# Patient Record
Sex: Male | Born: 1990
Health system: Southern US, Community
[De-identification: ages and names within clinical notes are randomized; demographics above are authoritative.]

---
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
## 2012-07-27 ENCOUNTER — Encounter (HOSPITAL_COMMUNITY): Payer: Self-pay | Admitting: *Deleted

## 2012-07-27 ENCOUNTER — Emergency Department (HOSPITAL_COMMUNITY): Payer: Medicaid Other

## 2012-07-27 ENCOUNTER — Emergency Department (HOSPITAL_COMMUNITY)
Admission: EM | Admit: 2012-07-27 | Discharge: 2012-07-27 | Disposition: A | Discharge status: Home / Self Care | Payer: Medicaid Other | Attending: Emergency Medicine | Admitting: Emergency Medicine

## 2012-07-27 DIAGNOSIS — W2209XA Striking against other stationary object, initial encounter: Secondary | ICD-10-CM | POA: Insufficient documentation

## 2012-07-27 DIAGNOSIS — Y9289 Other specified places as the place of occurrence of the external cause: Secondary | ICD-10-CM | POA: Insufficient documentation

## 2012-07-27 DIAGNOSIS — S62102A Fracture of unspecified carpal bone, left wrist, initial encounter for closed fracture: Secondary | ICD-10-CM

## 2012-07-27 DIAGNOSIS — F17200 Nicotine dependence, unspecified, uncomplicated: Secondary | ICD-10-CM | POA: Insufficient documentation

## 2012-07-27 DIAGNOSIS — Y939 Activity, unspecified: Secondary | ICD-10-CM | POA: Insufficient documentation

## 2012-07-27 DIAGNOSIS — S62109A Fracture of unspecified carpal bone, unspecified wrist, initial encounter for closed fracture: Secondary | ICD-10-CM | POA: Insufficient documentation

## 2012-07-27 MED ORDER — HYDROCODONE-ACETAMINOPHEN 5-325 MG PO TABS: 1.0000 | ORAL_TABLET | ORAL | Status: DC | PRN

## 2012-07-27 MED ORDER — IBUPROFEN 400 MG PO TABS
400.0000 mg | ORAL_TABLET | Freq: Once | ORAL | Status: AC
  Administered 2012-07-27: 400 mg via ORAL
  Filled 2012-07-27: qty 1

## 2012-07-27 MED ORDER — IBUPROFEN 600 MG PO TABS: 600.0000 mg | ORAL_TABLET | Freq: Four times a day (QID) | ORAL | Status: DC | PRN

## 2012-07-27 NOTE — ED Notes (Signed)
Pain both hands and wrists, struck a dresser today when angry, broke the glass on dresser.

## 2012-07-27 NOTE — ED Provider Notes (Signed)
History     CSN: [id number]  Arrival date & time 07/27/12  2155   First MD Initiated Contact with Patient 07/27/12 2254      Chief Complaint  Patient presents with  . Hand Pain    (Consider location/radiation/quality/duration/timing/severity/associated sxs/prior treatment) HPI Comments: Melvern L Nguyen is a 22 y.o. male presenting with pain in his left wrist and his right hand after striking them on a dresser top around 2 PM today out of anger.  His pain is worsened with palpation and range of motion.  He denies weakness or numbness in his hands distal to the injury site.  He has taken no medications prior to arrival but has used ice on his injuries without relief of symptoms.  He has radiation of pain to his distal forearm when he extends his left wrist.  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    The history is provided by the patient.    History reviewed. No pertinent past medical history.  History reviewed. No pertinent past surgical history.  History reviewed. No pertinent family history.  History  Substance Use Topics  . Smoking status: Current Every Day Smoker    Types: Cigarettes  . Smokeless tobacco: Not on file  . Alcohol Use: Yes      Review of Systems  Constitutional: Negative for fever.  Musculoskeletal: Positive for arthralgias. Negative for myalgias and joint swelling.  Skin: Negative for color change and wound.  Neurological: Negative for weakness and numbness.    Allergies  Review of patient's allergies indicates no known allergies.  Home Medications   Current Outpatient Rx  Name  Route  Sig  Dispense  Refill  . HYDROcodone-acetaminophen (NORCO/VICODIN) 5-325 MG per tablet   Oral   Take 1 tablet by mouth every 4 (four) hours as needed for pain.   15 tablet   0   . ibuprofen (ADVIL,MOTRIN) 600 MG tablet   Oral   Take 1 tablet (600 mg total) by mouth every 6 (six) hours as needed for pain.   20  tablet   0     BP 124/71  Pulse 121  Temp(Src) 98 F (36.7 C) (Oral)  Resp 20  Ht 5\' 5"  (1.651 m)  Wt 145 lb (65.772 kg)  BMI 24.13 kg/m2  SpO2 98%  Physical Exam  Constitutional: He appears well-developed and well-nourished.  HENT:  Head: Atraumatic.  Neck: Normal range of motion.  Cardiovascular: Normal rate and regular rhythm.   Pulses equal bilaterally.  recheck of pulse during exam was 90  Musculoskeletal: He exhibits tenderness.       Right hand: He exhibits tenderness and swelling. He exhibits no bony tenderness, normal two-point discrimination, normal capillary refill, no deformity and no laceration. Normal sensation noted. Normal strength noted.  Mild edema noted over lateral dorsal right hand.  No ecchymosis.  Left wrist is tender at ulnar styloid.  He has no pain with active or passive abduction or abduction of the wrist.  Pain is worsened with active and passive extension but not flexion.  Distal sensation is intact, less than 3 second cap refill.  Neurological: He is alert. He has normal strength. He displays normal reflexes. No sensory deficit.  Equal strength  Skin: Skin is warm and dry.  Psychiatric: He has a normal mood and affect.    ED Course  Procedures (including critical care time)  Labs Reviewed - No data to display Dg Wrist Complete  Left  07/27/2012  *RADIOLOGY REPORT*  Clinical Data: Hit dresser with both hands; left wrist pain.  LEFT WRIST - COMPLETE 3+ VIEW  Comparison: None.  Findings: There is no evidence of acute fracture or dislocation.  A mildly displaced ulnar styloid fragment likely reflects remote injury.  The carpal rows are intact, and demonstrate normal alignment.  The joint spaces are preserved.  No significant soft tissue abnormalities are seen.  IMPRESSION:  1.  No evidence of acute fracture or dislocation. 2.  Mildly displaced ulnar styloid fragment likely reflects remote injury.   Original Report Authenticated By: Tonia Ghent, M.D.     Dg Hand Complete Right  07/27/2012  *RADIOLOGY REPORT*  Clinical Data: Hit dresser with both hands; right hand pain.  RIGHT HAND - COMPLETE 3+ VIEW  Comparison: None.  Findings: There is no evidence of fracture or dislocation.  The joint spaces are preserved; the soft tissues are unremarkable in appearance.  The carpal rows are intact, and demonstrate normal alignment.  IMPRESSION: No evidence of fracture or dislocation.   Original Report Authenticated By: Tonia Ghent, M.D.      1. Wrist fracture, closed, left, initial encounter       MDM  Suspect the fragment in his left wrist at the ulnar styloid is new as this is the site of his pain.  He was placed in a Velcro wrist splint and prescribed ibuprofen 600 mg and hydrocodone.  Encouraged ice and elevation.  He was referred to Dr. Romeo Apple for further management of his injury.        Burgess Amor, PA-C 07/27/12 2330

## 2012-07-28 NOTE — ED Provider Notes (Signed)
Medical screening examination/treatment/procedure(s) were performed by non-physician practitioner and as supervising physician I was immediately available for consultation/collaboration.  Nicoletta Dress. Colon Branch, MD 07/28/12 [phone]

## 2012-12-07 ENCOUNTER — Encounter (HOSPITAL_COMMUNITY): Payer: Self-pay | Admitting: *Deleted

## 2012-12-07 ENCOUNTER — Emergency Department (HOSPITAL_COMMUNITY)
Admission: EM | Admit: 2012-12-07 | Discharge: 2012-12-07 | Disposition: A | Discharge status: Home / Self Care | Payer: Worker's Compensation | Attending: Emergency Medicine | Admitting: Emergency Medicine

## 2012-12-07 ENCOUNTER — Emergency Department (HOSPITAL_COMMUNITY): Payer: Worker's Compensation

## 2012-12-07 DIAGNOSIS — F17200 Nicotine dependence, unspecified, uncomplicated: Secondary | ICD-10-CM | POA: Insufficient documentation

## 2012-12-07 DIAGNOSIS — Y9389 Activity, other specified: Secondary | ICD-10-CM | POA: Insufficient documentation

## 2012-12-07 DIAGNOSIS — IMO0002 Reserved for concepts with insufficient information to code with codable children: Secondary | ICD-10-CM | POA: Insufficient documentation

## 2012-12-07 DIAGNOSIS — Y929 Unspecified place or not applicable: Secondary | ICD-10-CM | POA: Insufficient documentation

## 2012-12-07 DIAGNOSIS — S20219A Contusion of unspecified front wall of thorax, initial encounter: Secondary | ICD-10-CM | POA: Insufficient documentation

## 2012-12-07 DIAGNOSIS — Y990 Civilian activity done for income or pay: Secondary | ICD-10-CM | POA: Insufficient documentation

## 2012-12-07 DIAGNOSIS — S20211A Contusion of right front wall of thorax, initial encounter: Secondary | ICD-10-CM

## 2012-12-07 LAB — URINALYSIS, DIPSTICK ONLY
Ketones, ur: NEGATIVE mg/dL
Leukocytes, UA: NEGATIVE
Nitrite: NEGATIVE
Protein, ur: NEGATIVE mg/dL

## 2012-12-07 MED ORDER — IBUPROFEN 600 MG PO TABS: 600.0000 mg | ORAL_TABLET | Freq: Four times a day (QID) | ORAL | Status: DC | PRN
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
## 2012-12-08 NOTE — ED Provider Notes (Signed)
Medical screening examination/treatment/procedure(s) were performed by non-physician practitioner and as supervising physician I was immediately available for consultation/collaboration.   Junius Argyle, MD 12/08/12 1312

## 2013-07-20 ENCOUNTER — Encounter: Payer: Self-pay | Admitting: *Deleted

## 2013-08-03 ENCOUNTER — Ambulatory Visit (INDEPENDENT_AMBULATORY_CARE_PROVIDER_SITE_OTHER): Payer: 59 | Admitting: Family Medicine

## 2013-08-03 ENCOUNTER — Encounter: Payer: Self-pay | Admitting: Family Medicine

## 2013-08-03 VITALS — BP 126/82 | HR 82 | Temp 97.9°F | Resp 14 | Ht 66.5 in | Wt 144.0 lb

## 2013-08-03 DIAGNOSIS — Z23 Encounter for immunization: Secondary | ICD-10-CM

## 2013-08-03 DIAGNOSIS — F17200 Nicotine dependence, unspecified, uncomplicated: Secondary | ICD-10-CM

## 2013-08-03 DIAGNOSIS — Z0000 Encounter for general adult medical examination without abnormal findings: Secondary | ICD-10-CM

## 2013-08-03 DIAGNOSIS — L709 Acne, unspecified: Secondary | ICD-10-CM

## 2013-08-03 DIAGNOSIS — Z113 Encounter for screening for infections with a predominantly sexual mode of transmission: Secondary | ICD-10-CM

## 2013-08-03 DIAGNOSIS — L708 Other acne: Secondary | ICD-10-CM

## 2013-08-03 DIAGNOSIS — Z720 Tobacco use: Secondary | ICD-10-CM | POA: Insufficient documentation

## 2013-08-03 LAB — CBC WITH DIFFERENTIAL/PLATELET
BASOS ABS: 0.1 10*3/uL (ref 0.0–0.1)
Basophils Relative: 1 % (ref 0–1)
EOS ABS: 0.2 10*3/uL (ref 0.0–0.7)
Eosinophils Relative: 3 % (ref 0–5)
HEMATOCRIT: 40.1 % (ref 39.0–52.0)
Hemoglobin: 14.2 g/dL (ref 13.0–17.0)
LYMPHS ABS: 3.4 10*3/uL (ref 0.7–4.0)
Lymphocytes Relative: 65 % — ABNORMAL HIGH (ref 12–46)
MCH: 28.3 pg (ref 26.0–34.0)
MCHC: 35.4 g/dL (ref 30.0–36.0)
MCV: 79.9 fL (ref 78.0–100.0)
MONO ABS: 0.3 10*3/uL (ref 0.1–1.0)
Monocytes Relative: 5 % (ref 3–12)
Neutro Abs: 1.4 10*3/uL — ABNORMAL LOW (ref 1.7–7.7)
Neutrophils Relative %: 26 % — ABNORMAL LOW (ref 43–77)
PLATELETS: 303 10*3/uL (ref 150–400)
RBC: 5.02 MIL/uL (ref 4.22–5.81)
RDW: 14.5 % (ref 11.5–15.5)
WBC: 5.2 10*3/uL (ref 4.0–10.5)

## 2013-08-03 LAB — COMPREHENSIVE METABOLIC PANEL
ALBUMIN: 4.6 g/dL (ref 3.5–5.2)
ALT: 11 U/L (ref 0–53)
AST: 18 U/L (ref 0–37)
Alkaline Phosphatase: 58 U/L (ref 39–117)
BUN: 18 mg/dL (ref 6–23)
CALCIUM: 9.9 mg/dL (ref 8.4–10.5)
CHLORIDE: 102 meq/L (ref 96–112)
CO2: 27 meq/L (ref 19–32)
Creat: 1.04 mg/dL (ref 0.50–1.35)
Glucose, Bld: 90 mg/dL (ref 70–99)
Potassium: 4.1 mEq/L (ref 3.5–5.3)
Sodium: 139 mEq/L (ref 135–145)
Total Bilirubin: 0.9 mg/dL (ref 0.2–1.2)
Total Protein: 7.7 g/dL (ref 6.0–8.3)

## 2013-08-03 LAB — LIPID PANEL
Cholesterol: 159 mg/dL (ref 0–200)
HDL: 53 mg/dL (ref >39)
LDL Cholesterol: 92 mg/dL (ref 0–99)
Total CHOL/HDL Ratio: 3 Ratio
Triglycerides: 70 mg/dL (ref <150)
VLDL: 14 mg/dL (ref 0–40)

## 2013-08-03 LAB — HIV ANTIBODY (ROUTINE TESTING W REFLEX): HIV 1&2 Ab, 4th Generation: NONREACTIVE

## 2013-08-03 LAB — RPR

## 2013-08-03 MED ORDER — BENZOYL PEROXIDE 10 % EX CREA: TOPICAL_CREAM | CUTANEOUS | Status: DC

## 2013-08-03 NOTE — Assessment & Plan Note (Addendum)
Physical done. Fasting labs done. STD check to be done. Discussed safe sex  Also discussed tobacco cessation advised he can try patches or the paper cigarette. He is going to think about this Discussed abstinence from any illicit drugs TDAP given

## 2013-08-03 NOTE — Patient Instructions (Signed)
Release of records- sign and we will figure out who previous doctor was I recommend eye visit once a year I recommend dental visit every 6 months Goal is to  Exercise 30 minutes 5 days a week We will call  with lab results  Try the benzyl peroxide Use Cetaphil washer and moisturizer  Work on the smoking If you want to try nicotine patches let us know F/U as needed

## 2013-08-03 NOTE — Assessment & Plan Note (Signed)
He has some mild acne mostly in the T-zone region. I will have him use that I fill a will also try him on benzyl peroxide

## 2013-08-03 NOTE — Progress Notes (Signed)
Patient ID: Mario Nguyen, male   DOB: 05/06/1990, 23 y.o.   MRN: [id number]   Subjective:    Patient ID: Mario Nguyen, male    DOB: 12/19/1990, 23 y.o.   MRN: [id number]  Patient presents for CPE  patient here for complete physical exam. He is a new patient. He has no past medical history he cannot recall where his previous PCP was from. He currently works full-time at [employer]. He did not finish his high school degree and is thinking about going back to get his GED. He is a smoker about 10 cigarettes a day. He is sexually active with a male partner. He does note that about a month or so ago he had some mild rectal bleeding has not had any since. He does admit to anal sex. To be checked for sex but transmitted diseases    Review Of Systems:  GEN- denies fatigue, fever, weight loss,weakness, recent illness HEENT- denies eye drainage, change in vision, nasal discharge, CVS- denies chest pain, palpitations RESP- denies SOB, cough, wheeze ABD- denies N/V, change in stools, abd pain GU- denies dysuria, hematuria, dribbling, incontinence MSK- denies joint pain, muscle aches, injury Neuro- denies headache, dizziness, syncope, seizure activity       Objective:    BP 126/82  Pulse 82  Temp(Src) 97.9 F (36.6 C) (Oral)  Resp 14  Ht 5' 6.5" (1.689 m)  Wt 144 lb (65.318 kg)  BMI 22.90 kg/m2 GEN- NAD, alert and oriented x3 HEENT- PERRL, EOMI, non injected sclera, pink conjunctiva, MMM, oropharynx clear Neck- Supple, no LAD CVS- RRR, no murmur RESP-CTAB ABD-NABS,soft,NT,ND Rectum- Deferred EXT- No edema Pulses- Radial, DP- 2+ Psych- normal affect and mood Skin- acne on chin and forehead, no large pustules, dry skin, few blackheads on nose       Assessment & Plan:      Problem List Items Addressed This Visit   None      Note: This dictation was prepared with Dragon dictation along with smaller phrase technology. Any transcriptional errors that result  from this process are unintentional.

## 2013-08-03 NOTE — Assessment & Plan Note (Signed)
Counsel on cessation

## 2013-08-04 LAB — GC PROBE AMPLIFICATION, URINE: GC PROBE AMP, URINE: NEGATIVE

## 2013-10-28 ENCOUNTER — Encounter (HOSPITAL_COMMUNITY): Payer: Self-pay | Admitting: Emergency Medicine

## 2013-10-28 ENCOUNTER — Emergency Department (HOSPITAL_COMMUNITY)
Admission: EM | Admit: 2013-10-28 | Discharge: 2013-10-28 | Disposition: A | Discharge status: Home / Self Care | Payer: 59 | Attending: Emergency Medicine | Admitting: Emergency Medicine

## 2013-10-28 DIAGNOSIS — S51809A Unspecified open wound of unspecified forearm, initial encounter: Secondary | ICD-10-CM | POA: Insufficient documentation

## 2013-10-28 DIAGNOSIS — W503XXA Accidental bite by another person, initial encounter: Secondary | ICD-10-CM

## 2013-10-28 DIAGNOSIS — S51851A Open bite of right forearm, initial encounter: Secondary | ICD-10-CM

## 2013-10-28 MED ORDER — AMOXICILLIN-POT CLAVULANATE 875-125 MG PO TABS
1.0000 | ORAL_TABLET | Freq: Once | ORAL | Status: AC
  Administered 2013-10-28: 1 via ORAL
  Filled 2013-10-28: qty 1

## 2013-10-28 MED ORDER — AMOXICILLIN-POT CLAVULANATE 875-125 MG PO TABS: 1.0000 | ORAL_TABLET | Freq: Two times a day (BID) | ORAL | Status: DC
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
## 2013-11-01 NOTE — ED Provider Notes (Signed)
Medical screening examination/treatment/procedure(s) were performed by non-physician practitioner and as supervising physician I was immediately available for consultation/collaboration.   EKG Interpretation None        Kyriana Yankee L Lisamarie Coke, MD 11/01/13 0710 

## 2014-10-06 ENCOUNTER — Emergency Department (HOSPITAL_COMMUNITY)
Admission: EM | Admit: 2014-10-06 | Discharge: 2014-10-06 | Disposition: A | Discharge status: Home / Self Care | Payer: Medicaid Other | Attending: Emergency Medicine | Admitting: Emergency Medicine

## 2014-10-06 ENCOUNTER — Encounter (HOSPITAL_COMMUNITY): Payer: Self-pay | Admitting: *Deleted

## 2014-10-06 DIAGNOSIS — Z720 Tobacco use: Secondary | ICD-10-CM | POA: Insufficient documentation

## 2014-10-06 DIAGNOSIS — H53149 Visual discomfort, unspecified: Secondary | ICD-10-CM | POA: Insufficient documentation

## 2014-10-06 DIAGNOSIS — H109 Unspecified conjunctivitis: Secondary | ICD-10-CM | POA: Insufficient documentation

## 2014-10-06 MED ORDER — KETOROLAC TROMETHAMINE 0.5 % OP SOLN
1.0000 [drp] | Freq: Once | OPHTHALMIC | Status: AC
  Administered 2014-10-06: 1 [drp] via OPHTHALMIC
  Filled 2014-10-06: qty 5

## 2014-10-06 MED ORDER — TOBRAMYCIN 0.3 % OP SOLN
1.0000 [drp] | Freq: Once | OPHTHALMIC | Status: AC
  Administered 2014-10-06: 1 [drp] via OPHTHALMIC
  Filled 2014-10-06: qty 5

## 2014-10-06 NOTE — Discharge Instructions (Signed)

## 2014-10-06 NOTE — ED Notes (Signed)
Lt upper lid swollen, d/c present.  Rt eye 20/20,  Lt eye 20/30  Both 20/20.  No  Injury.

## 2014-10-06 NOTE — ED Notes (Signed)
Pt states pain to left eye began 2 days ago. Woke up yesterday morning with swelling to left eye lid.

## 2014-10-08 NOTE — ED Provider Notes (Signed)
CSN: [id number]     Arrival date & time 10/06/14  4098 History   First MD Initiated Contact with Patient 10/06/14 1125     Chief Complaint  Patient presents with  . Eye Pain     (Consider location/radiation/quality/duration/timing/severity/associated sxs/prior Treatment) HPI   Mario Nguyen is a 24 y.o. male who presents to the Emergency Department complaining of discharge and pain to left eye that began 2 days ago.  Woke up one day prior to arrival with swelling to his left upper eyelid.  He describes irritation to his eye with blinking and sunlight.  reports having green discharge to both corners of his eyes and some itching.  He denies headache, dizziness, visual changes, fever, contact use or injury.  He has not tried any medication or therapies for the symptoms.     History reviewed. No pertinent past medical history. History reviewed. No pertinent past surgical history. Family History  Problem Relation Age of Onset  . Hypertension Mother   . Learning disabilities Maternal Grandmother   . Stroke Maternal Grandfather    History  Substance Use Topics  . Smoking status: Current Every Day Smoker    Types: Cigarettes  . Smokeless tobacco: Never Used  . Alcohol Use: No    Review of Systems  Constitutional: Negative for fever and appetite change.  HENT: Negative for congestion, rhinorrhea and sore throat.   Eyes: Positive for photophobia, pain, discharge and itching.  Respiratory: Negative for cough.   Gastrointestinal: Negative for nausea and vomiting.  Neurological: Negative for dizziness, numbness and headaches.  All other systems reviewed and are negative.     Allergies  Review of patient's allergies indicates no known allergies.  Home Medications   Prior to Admission medications   Not on File   BP 112/96 mmHg  Pulse 69  Temp(Src) 98.4 F (36.9 C) (Oral)  Resp 16  SpO2 100% Physical Exam  Constitutional: He is oriented to person, place, and time. He  appears well-developed and well-nourished. No distress.  HENT:  Head: Normocephalic and atraumatic.  Mouth/Throat: Oropharynx is clear and moist.  Eyes: EOM are normal. Pupils are equal, round, and reactive to light. Lids are everted and swept, no foreign bodies found. Left eye exhibits exudate. Left eye exhibits no hordeolum. No foreign body present in the left eye. Right conjunctiva is not injected. Left conjunctiva is injected. Left conjunctiva has no hemorrhage.  Mild edema of the left upper eyelid.  No periorbital erythema or edema.  Exudate present at medial and lateral canthus.    Neck: Normal range of motion. Neck supple. No thyromegaly present.  Cardiovascular: Normal rate and regular rhythm.   No murmur heard. Pulmonary/Chest: Effort normal and breath sounds normal. No respiratory distress.  Musculoskeletal: Normal range of motion.  Lymphadenopathy:    He has no cervical adenopathy.  Neurological: He is alert and oriented to person, place, and time. He exhibits normal muscle tone. Coordination normal.  Skin: Skin is warm and dry. No rash noted.  Nursing note and vitals reviewed.   ED Course  Procedures (including critical care time) Labs Review Labs Reviewed - No data to display  Imaging Review No results found.   EKG Interpretation None      MDM   Final diagnoses:  Conjunctivitis of left eye      Visual Acuity  Rt eye 20/20, Lt eye 20/30 Both 20/20.   Pt well appearing.  Dispensed ketorolac and tobramycin.  Agrees to warm compresses and ophtho f/u  if needed.    Pauline Aus, PA-C 10/08/14 1509  Vanetta Mulders, MD 10/11/14 2010

## 2014-10-09 ENCOUNTER — Ambulatory Visit: Payer: Self-pay | Admitting: Family Medicine

## 2014-10-10 ENCOUNTER — Encounter: Payer: Self-pay | Admitting: Family Medicine

## 2014-10-10 ENCOUNTER — Ambulatory Visit (INDEPENDENT_AMBULATORY_CARE_PROVIDER_SITE_OTHER): Payer: PRIVATE HEALTH INSURANCE | Admitting: Family Medicine

## 2014-10-10 VITALS — BP 118/62 | HR 68 | Temp 97.9°F | Resp 16 | Ht 66.5 in | Wt 141.0 lb

## 2014-10-10 DIAGNOSIS — H109 Unspecified conjunctivitis: Secondary | ICD-10-CM | POA: Diagnosis not present

## 2014-10-10 MED ORDER — TOBRAMYCIN 0.3 % OP SOLN: 1.0000 [drp] | Freq: Four times a day (QID) | OPHTHALMIC | Status: DC

## 2014-10-10 NOTE — Progress Notes (Signed)
Patient ID: Mario Nguyen, male   DOB: 1990/12/16, 24 y.o.   MRN: [id number]   Subjective:    Patient ID: Mario Nguyen, male    DOB: Jan 19, 1991, 24 y.o.   MRN: [id number]  Patient presents for Eye Drainage  Patient here with conjunctivitis. She was seen in the emergency room on Sunday the 19th he was prescribed Tobrex eyedrops as well as Ketorolac for pain. His drainage and swelling started with his left upper lid then he had yellow and green drainage from the eye. He follows with the emergency room on Sunday. He did not have any change in his vision no fever no cough or congestion no tear pain. Per above prescribed eyedrops were given. The next day he noticed drainage from his right eye he did put a drop in his right eye yesterday. The discomfort has improved but he still has some drainage from his eyes. He does not wear contacts. Review Of Systems:  GEN- denies fatigue, fever, weight loss,weakness, recent illness HEENT- + eye drainage,denies change in vision, nasal discharge, CVS- denies chest pain, palpitations RESP- denies SOB, cough, wheeze ABD- denies N/V, change in stools, abd pain GU- denies dysuria, hematuria, dribbling, incontinence MSK- denies joint pain, muscle aches, injury Neuro- denies headache, dizziness, syncope, seizure activity       Objective:    BP 118/62 mmHg  Pulse 68  Temp(Src) 97.9 F (36.6 C) (Oral)  Resp 16  Ht 5' 6.5" (1.689 m)  Wt 141 lb (63.957 kg)  BMI 22.42 kg/m2 GEN- NAD, alert and oriented x3 HEENT- PERRL, EOMI, non injected sclera, + injected bilat conjunctiva upper and lower, Left upper lid mild swelling on nasal side- small white pustule seen , NT, mild peeling of skin of upper lid MMM, oropharynx clear, vision in tact Neck- Supple, no LAD        Assessment & Plan:      Problem List Items Addressed This Visit    None    Visit Diagnoses    Bilateral conjunctivitis    -  Primary    Bilat conjunctivitis, may be developing  stye on left upper lid as well, advised warm compressses, use tobrex in both eyes, drop refilled       Note: This dictation was prepared with Dragon dictation along with smaller phrase technology. Any transcriptional errors that result from this process are unintentional.

## 2014-10-10 NOTE — Patient Instructions (Signed)
Use warm compresses four times a day  Put antibiotic drop in both eyes, prescription refilled F/U as needed

## 2014-10-17 ENCOUNTER — Telehealth: Payer: Self-pay | Admitting: General Practice

## 2014-10-17 NOTE — Telephone Encounter (Signed)
Patient was not given work note from us.   Why is he needing one now and not for the week before?

## 2014-10-17 NOTE — Telephone Encounter (Signed)
Pt called he is needing a work note to be out of work on Wednesday June 29 - June 30 states that his eye is no better with antibiotics. Pt was seen on June 21st with eye issues.

## 2014-10-18 NOTE — Telephone Encounter (Signed)
Pt aware if symptoms have returned needs to come in for OV, pt states that that he will go to his eye doctor and get work excuse.

## 2014-10-18 NOTE — Telephone Encounter (Signed)
I asked that same question, he said he was fine but now his eyes are still hurting and wanted to get a work note for this time period

## 2014-10-18 NOTE — Telephone Encounter (Signed)
Needs to be seen if S/Sx returned.

## 2015-02-20 ENCOUNTER — Ambulatory Visit: Payer: PRIVATE HEALTH INSURANCE | Admitting: Family Medicine

## 2015-02-21 ENCOUNTER — Ambulatory Visit: Payer: PRIVATE HEALTH INSURANCE | Admitting: Family Medicine

## 2015-08-29 ENCOUNTER — Other Ambulatory Visit: Payer: Self-pay | Admitting: Family Medicine

## 2015-08-29 ENCOUNTER — Encounter: Payer: Self-pay | Admitting: Family Medicine

## 2015-08-29 ENCOUNTER — Ambulatory Visit (INDEPENDENT_AMBULATORY_CARE_PROVIDER_SITE_OTHER): Payer: PRIVATE HEALTH INSURANCE | Admitting: Family Medicine

## 2015-08-29 VITALS — BP 118/64 | HR 88 | Temp 98.5°F | Resp 12 | Ht 67.0 in | Wt 139.0 lb

## 2015-08-29 DIAGNOSIS — Z113 Encounter for screening for infections with a predominantly sexual mode of transmission: Secondary | ICD-10-CM | POA: Diagnosis not present

## 2015-08-29 DIAGNOSIS — M25561 Pain in right knee: Secondary | ICD-10-CM

## 2015-08-29 DIAGNOSIS — Z0000 Encounter for general adult medical examination without abnormal findings: Secondary | ICD-10-CM

## 2015-08-29 DIAGNOSIS — F17200 Nicotine dependence, unspecified, uncomplicated: Secondary | ICD-10-CM | POA: Diagnosis not present

## 2015-08-29 LAB — CBC WITH DIFFERENTIAL/PLATELET
BASOS ABS: 63 {cells}/uL (ref 0–200)
BASOS PCT: 1 %
EOS ABS: 126 {cells}/uL (ref 15–500)
Eosinophils Relative: 2 %
HEMATOCRIT: 43 % (ref 38.5–50.0)
HEMOGLOBIN: 14.8 g/dL (ref 13.0–17.0)
LYMPHS ABS: 3087 {cells}/uL (ref 850–3900)
Lymphocytes Relative: 49 %
MCH: 28.2 pg (ref 27.0–33.0)
MCHC: 34.4 g/dL (ref 32.0–36.0)
MCV: 81.9 fL (ref 80.0–100.0)
MONO ABS: 252 {cells}/uL (ref 200–950)
MONOS PCT: 4 %
MPV: 8.3 fL (ref 7.5–12.5)
NEUTROS ABS: 2772 {cells}/uL (ref 1500–7800)
Neutrophils Relative %: 44 %
PLATELETS: 302 10*3/uL (ref 140–400)
RBC: 5.25 MIL/uL (ref 4.20–5.80)
RDW: 14.8 % (ref 11.0–15.0)
WBC: 6.3 10*3/uL (ref 3.8–10.8)

## 2015-08-29 LAB — URINALYSIS, MICROSCOPIC ONLY
CASTS: NONE SEEN [LPF]
Crystals: NONE SEEN [HPF]
RBC / HPF: NONE SEEN RBC/HPF (ref <=2)
SQUAMOUS EPITHELIAL / LPF: NONE SEEN [HPF] (ref <=5)
WBC, UA: NONE SEEN WBC/HPF (ref <=5)
YEAST: NONE SEEN [HPF]

## 2015-08-29 LAB — URINALYSIS, ROUTINE W REFLEX MICROSCOPIC
BILIRUBIN URINE: NEGATIVE
GLUCOSE, UA: NEGATIVE
HGB URINE DIPSTICK: NEGATIVE
LEUKOCYTES UA: NEGATIVE
Nitrite: NEGATIVE
Specific Gravity, Urine: >1.03 (ref 1.001–1.035)
pH: 6 (ref 5.0–8.0)

## 2015-08-29 LAB — TSH: TSH: 0.92 m[IU]/L (ref 0.40–4.50)

## 2015-08-29 NOTE — Progress Notes (Signed)
Patient ID: Mario Nguyen, male   DOB: 06/18/1990, 25 y.o.   MRN: [id number]    Subjective:    Patient ID: Mario Nguyen, male    DOB: 03/07/1991, 25 y.o.   MRN: [id number]  Patient presents for CPE and L Eye Pain  Pt here for complete physical exam. He is still concerned he has some irritation to his left eye he has not had any drainage no abnormal watering no redness does not remember getting anything in the eye. He did flush it. His vision is intact.  He occasionally gets some discomfort in his left knee he states that he gave out on him at work he does wear steele toe boots and works on concrete floors 12 hour shifts. No particular injury to the knee has not had any swelling.  He is not on any regular medications. He does continue to smoke about half a pack per day. He states he did not want to nicoderm patch because his mother was concerned about the chemicals in them??  He is sexually active, does not use condoms Also states urine has been dark, denies penile discharge   TDAP UTD   Review Of Systems:  GEN- denies fatigue, fever, weight loss,weakness, recent illness HEENT- denies eye drainage, change in vision, nasal discharge, CVS- denies chest pain, palpitations RESP- denies SOB, cough, wheeze ABD- denies N/V, change in stools, abd pain GU- denies dysuria, hematuria, dribbling, incontinence MSK- denies joint pain, muscle aches, injury Neuro- denies headache, dizziness, syncope, seizure activity       Objective:    BP 118/64 mmHg  Pulse 88  Temp(Src) 98.5 F (36.9 C) (Oral)  Resp 12  Ht 5\' 7"  (1.702 m)  Wt 139 lb (63.05 kg)  BMI 21.77 kg/m2 GEN- NAD, alert and oriented x3 HEENT- PERRL, EOMI, non injected sclera, pink conjunctiva, MMM, oropharynx clear Neck- Supple, no thyromegaly CVS- RRR, no murmur RESP-CTAB ABD-NABS,soft,NT,ND Knee- Normal inspection, FROM bilat knee, ligaments in tact  EXT- No edema Pulses- Radial, DP- 2+        Assessment &  Plan:      Problem List Items Addressed This Visit    Tobacco use disorder    counsled on tobacco cessation      Screening for STD (sexually transmitted disease)   Relevant Orders   HIV antibody   Urinalysis, Routine w reflex microscopic ([facility]) (Completed)   HSV(herpes smplx)abs-1+2(IgG+IgM)-bld   RPR   GC/chlamydia probe amp, urine   Routine general medical examination at a health care facility - Primary    CPE done, STD screen, normal eye exam, can use visine, may be pollen irritating eye      Relevant Orders   CBC with Differential/Platelet   Comprehensive metabolic panel   Lipid panel   TSH    Other Visit Diagnoses    Right knee pain        Normal exam, if this wosrens or has swelling will have evaluated further       Note: This dictation was prepared with Dragon dictation along with smaller phrase technology. Any transcriptional errors that result from this process are unintentional.

## 2015-08-29 NOTE — Patient Instructions (Signed)
Call if pain worsens or you get swelling of your knee We will call with lab results Okay to use Visine in eye for irritation F/U as needed or in 1year

## 2015-08-29 NOTE — Assessment & Plan Note (Signed)
counsled on tobacco cessation 

## 2015-08-29 NOTE — Assessment & Plan Note (Addendum)
CPE done, STD screen, normal eye exam, can use visine, may be pollen irritating eye

## 2015-08-30 LAB — HIV ANTIBODY (ROUTINE TESTING W REFLEX): HIV: NONREACTIVE

## 2015-08-30 LAB — COMPREHENSIVE METABOLIC PANEL
ALBUMIN: 4.8 g/dL (ref 3.6–5.1)
ALK PHOS: 66 U/L (ref 40–115)
ALT: 17 U/L (ref 9–46)
AST: 23 U/L (ref 10–40)
BUN: 18 mg/dL (ref 7–25)
CO2: 25 mmol/L (ref 20–31)
Calcium: 9.8 mg/dL (ref 8.6–10.3)
Chloride: 102 mmol/L (ref 98–110)
Creat: 1.05 mg/dL (ref 0.60–1.35)
GLUCOSE: 81 mg/dL (ref 70–99)
POTASSIUM: 4.2 mmol/L (ref 3.5–5.3)
Sodium: 138 mmol/L (ref 135–146)
TOTAL PROTEIN: 7.9 g/dL (ref 6.1–8.1)
Total Bilirubin: 0.8 mg/dL (ref 0.2–1.2)

## 2015-08-30 LAB — LIPID PANEL
CHOL/HDL RATIO: 3.6 ratio (ref <=5.0)
Cholesterol: 160 mg/dL (ref 125–200)
HDL: 44 mg/dL (ref >=40)
LDL Cholesterol: 101 mg/dL (ref <130)
Triglycerides: 76 mg/dL (ref <150)
VLDL: 15 mg/dL (ref <30)

## 2015-08-30 LAB — HSV(HERPES SMPLX)ABS-I+II(IGG+IGM)-BLD
HSV 1 Glycoprotein G Ab, IgG: 32.9 Index — ABNORMAL HIGH (ref <0.90)
HSV 2 Glycoprotein G Ab, IgG: <0.9 Index (ref <0.90)
Herpes Simplex Vrs I&II-IgM Ab (EIA): 4.31 INDEX — ABNORMAL HIGH

## 2015-08-30 LAB — GC/CHLAMYDIA PROBE AMP
CT Probe RNA: NOT DETECTED
GC Probe RNA: NOT DETECTED

## 2015-08-30 LAB — RPR

## 2015-09-25 ENCOUNTER — Encounter (HOSPITAL_COMMUNITY): Payer: Self-pay | Admitting: Emergency Medicine

## 2015-09-25 ENCOUNTER — Emergency Department (HOSPITAL_COMMUNITY): Payer: Self-pay

## 2015-09-25 ENCOUNTER — Emergency Department (HOSPITAL_COMMUNITY)
Admission: EM | Admit: 2015-09-25 | Discharge: 2015-09-25 | Disposition: A | Discharge status: Home / Self Care | Payer: Self-pay | Attending: Emergency Medicine | Admitting: Emergency Medicine

## 2015-09-25 DIAGNOSIS — F17210 Nicotine dependence, cigarettes, uncomplicated: Secondary | ICD-10-CM | POA: Insufficient documentation

## 2015-09-25 DIAGNOSIS — Y999 Unspecified external cause status: Secondary | ICD-10-CM | POA: Insufficient documentation

## 2015-09-25 DIAGNOSIS — Y929 Unspecified place or not applicable: Secondary | ICD-10-CM | POA: Insufficient documentation

## 2015-09-25 DIAGNOSIS — W228XXA Striking against or struck by other objects, initial encounter: Secondary | ICD-10-CM | POA: Insufficient documentation

## 2015-09-25 DIAGNOSIS — S9032XA Contusion of left foot, initial encounter: Secondary | ICD-10-CM | POA: Insufficient documentation

## 2015-09-25 DIAGNOSIS — Y939 Activity, unspecified: Secondary | ICD-10-CM | POA: Insufficient documentation

## 2015-09-25 NOTE — Discharge Instructions (Signed)

## 2015-09-25 NOTE — ED Provider Notes (Addendum)
CSN: [id number]     Arrival date & time 09/25/15  1841 History   First MD Initiated Contact with Patient 09/25/15 1918     Chief Complaint  Patient presents with  . Foot Pain     (Consider location/radiation/quality/duration/timing/severity/associated sxs/prior Treatment) Patient is a 25 y.o. male presenting with lower extremity pain. The history is provided by the patient. No language interpreter was used.  Foot Pain This is a new problem. The current episode started today. The problem occurs constantly. The problem has been unchanged. Associated symptoms include myalgias. Pertinent negatives include no joint swelling or numbness. Nothing aggravates the symptoms. He has tried nothing for the symptoms. The treatment provided moderate relief.    History reviewed. No pertinent past medical history. History reviewed. No pertinent past surgical history. Family History  Problem Relation Age of Onset  . Hypertension Mother   . Learning disabilities Maternal Grandmother   . Stroke Maternal Grandfather    Social History  Substance Use Topics  . Smoking status: Current Every Day Smoker -- 0.50 packs/day    Types: Cigarettes  . Smokeless tobacco: Never Used  . Alcohol Use: Yes     Comment: occasional    Review of Systems  Musculoskeletal: Positive for myalgias. Negative for joint swelling.  Neurological: Negative for numbness.  All other systems reviewed and are negative.     Allergies  Review of patient's allergies indicates no known allergies.  Home Medications   Prior to Admission medications   Not on File   BP 123/72 mmHg  Pulse 103  Temp(Src) 98.9 F (37.2 C) (Oral)  Resp 18  Ht 5\' 5"  (1.651 m)  Wt 63.05 kg  BMI 23.13 kg/m2  SpO2 100% Physical Exam  Constitutional: He is oriented to person, place, and time. He appears well-developed and well-nourished.  HENT:  Head: Normocephalic.  Musculoskeletal: He exhibits tenderness.  Tender left foot,  Pain with movement   Neurological: He is alert and oriented to person, place, and time. He has normal reflexes.  Skin: Skin is warm.  Psychiatric: He has a normal mood and affect.  Vitals reviewed.   ED Course  Procedures (including critical care time) Labs Review Labs Reviewed - No data to display  Imaging Review Dg Foot Complete Left  09/25/2015  CLINICAL DATA:  25 year old male stepped on foreign object presenting with foot pain. EXAM: LEFT FOOT - COMPLETE 3+ VIEW COMPARISON:  None. FINDINGS: There is no acute fracture or dislocation. The bones are well mineralized. The soft tissues appear unremarkable. No radiopaque foreign object identified. IMPRESSION: Negative. Electronically Signed   By: Elgie CollardArash  Radparvar M.D.   On: 09/25/2015 19:05   I have personally reviewed and evaluated these images and lab results as part of my medical decision-making.   EKG Interpretation None      MDM Pt advised to follow up with primary if apin persist   Final diagnoses:  Contusion of left foot, initial encounter    An After Visit Summary was printed and given to the patient. Soak foot x 4 times a day  Ibuprofen    Elson AreasLeslie K Rillie Riffel, PA-C 09/25/15 2143  Bethann BerkshireJoseph Zammit, MD 09/27/15 1234  Lonia SkinnerLeslie K East RutherfordSofia, PA-C 11/12/15 1133  Bethann BerkshireJoseph Zammit, MD 11/12/15 1430

## 2015-09-25 NOTE — ED Notes (Signed)
Pt reports stepped on "something sharp" earlier this afternoon. Pt reports pain in left foot ever since. No foreign body noted. Small reddened area noted to bottom of left foot. nad noted.

## 2016-03-06 ENCOUNTER — Encounter: Payer: Self-pay | Admitting: Physician Assistant

## 2016-03-06 ENCOUNTER — Ambulatory Visit (INDEPENDENT_AMBULATORY_CARE_PROVIDER_SITE_OTHER): Payer: Self-pay | Admitting: Physician Assistant

## 2016-03-06 VITALS — BP 110/70 | HR 89 | Temp 98.4°F | Resp 16 | Wt 146.0 lb

## 2016-03-06 DIAGNOSIS — J020 Streptococcal pharyngitis: Secondary | ICD-10-CM

## 2016-03-06 DIAGNOSIS — J988 Other specified respiratory disorders: Secondary | ICD-10-CM

## 2016-03-06 DIAGNOSIS — B9789 Other viral agents as the cause of diseases classified elsewhere: Secondary | ICD-10-CM

## 2016-03-06 LAB — STREP GROUP A AG, W/REFLEX TO CULT: STREGTOCOCCUS GROUP A AG SCREEN: NOT DETECTED

## 2016-03-06 NOTE — Progress Notes (Signed)
    Patient ID: Mario Nguyen MRN: [id number], DOB: 09/12/1990, 25 y.o. Date of Encounter: 03/06/2016, 3:37 PM    Chief Complaint:  Chief Complaint  Patient presents with  . Sore Throat    started 11-14     HPI: 25 y.o. year old male presetns with abov.   Says the symptoms just started Tuesday 03/06/16. Says at that time he was just feeling some drainage in his throat. He has continued with same symptoms. Says that right now his throat just feels itchy and feels like there is some drainage in there. Says he has had no cough. No mucus from his nose. No fever.     Home Meds:   No outpatient prescriptions prior to visit.   No facility-administered medications prior to visit.     Allergies: No Known Allergies    Review of Systems: See HPI for pertinent ROS. All other ROS negative.    Physical Exam: Blood pressure 110/70, pulse 89, temperature 98.4 F (36.9 C), temperature source Oral, resp. rate 16, weight 146 lb (66.2 kg), SpO2 99 %., Body mass index is 24.3 kg/m. General:  WNWD AAM. Appears in no acute distress. HEENT: Normocephalic, atraumatic, eyes without discharge, sclera non-icteric, nares are without discharge. Bilateral auditory canals clear, TM's are without perforation, pearly grey and translucent with reflective cone of light bilaterally. Oral cavity moist, posterior pharynx without exudate, erythema, peritonsillar abscess.  Neck: Supple. No thyromegaly. No lymphadenopathy. Lungs: Clear bilaterally to auscultation without wheezes, rales, or rhonchi. Breathing is unlabored. Heart: Regular rhythm. No murmurs, rubs, or gallops. Msk:  Strength and tone normal for age. Extremities/Skin: Warm and dry. Neuro: Alert and oriented X 3. Moves all extremities spontaneously. Gait is normal. CNII-XII grossly in tact. Psych:  Responds to questions appropriately with a normal affect.   Results for orders placed or performed in visit on 03/06/16  STREP GROUP A AG, W/REFLEX  TO CULT  Result Value Ref Range   SOURCE THROAT    STREGTOCOCCUS GROUP A AG SCREEN Not Detected      ASSESSMENT AND PLAN:  25 y.o. year old male with  1. Viral respiratory infection Discussed with him that this is most likely a virus in which case this will run its course and resolve on its own over the week. If symptoms worsen significantly or develops fever then follow-up. As well if symptoms persist greater than 7-10 days  follow-up also.  2. Streptococcal sore throat - STREP GROUP A AG, W/REFLEX TO CULT   Signed, [address] W[address]Mary Beth Garza-Salinas IIDixon, [place], [facility] 03/06/2016 3:37 PM

## 2016-03-08 LAB — CULTURE, GROUP A STREP: Organism ID, Bacteria: NORMAL

## 2016-07-30 ENCOUNTER — Encounter: Payer: Self-pay | Admitting: Family Medicine

## 2016-08-25 ENCOUNTER — Encounter: Payer: Self-pay | Admitting: Family Medicine

## 2016-11-04 ENCOUNTER — Encounter: Payer: Self-pay | Admitting: Family Medicine

## 2016-11-04 ENCOUNTER — Emergency Department (HOSPITAL_COMMUNITY)
Admission: EM | Admit: 2016-11-04 | Discharge: 2016-11-04 | Disposition: A | Discharge status: Home / Self Care | Payer: Medicaid Other | Attending: Emergency Medicine | Admitting: Emergency Medicine

## 2016-11-04 ENCOUNTER — Encounter (HOSPITAL_COMMUNITY): Payer: Self-pay

## 2016-11-04 DIAGNOSIS — F17210 Nicotine dependence, cigarettes, uncomplicated: Secondary | ICD-10-CM | POA: Insufficient documentation

## 2016-11-04 DIAGNOSIS — R197 Diarrhea, unspecified: Secondary | ICD-10-CM | POA: Insufficient documentation

## 2016-11-04 DIAGNOSIS — R112 Nausea with vomiting, unspecified: Secondary | ICD-10-CM | POA: Insufficient documentation

## 2016-11-04 LAB — URINALYSIS, ROUTINE W REFLEX MICROSCOPIC
Bacteria, UA: NONE SEEN
GLUCOSE, UA: NEGATIVE mg/dL
HGB URINE DIPSTICK: NEGATIVE
KETONES UR: 20 mg/dL — AB
Leukocytes, UA: NEGATIVE
NITRITE: NEGATIVE
PH: 5 (ref 5.0–8.0)
Protein, ur: 100 mg/dL — AB
RBC / HPF: NONE SEEN RBC/hpf (ref 0–5)
Specific Gravity, Urine: 1.033 — ABNORMAL HIGH (ref 1.005–1.030)

## 2016-11-04 LAB — COMPREHENSIVE METABOLIC PANEL
ALK PHOS: 55 U/L (ref 38–126)
ALT: 30 U/L (ref 17–63)
ANION GAP: 13 (ref 5–15)
AST: 26 U/L (ref 15–41)
Albumin: 5.2 g/dL — ABNORMAL HIGH (ref 3.5–5.0)
BUN: 20 mg/dL (ref 6–20)
CALCIUM: 10 mg/dL (ref 8.9–10.3)
CO2: 27 mmol/L (ref 22–32)
CREATININE: 1.16 mg/dL (ref 0.61–1.24)
Chloride: 97 mmol/L — ABNORMAL LOW (ref 101–111)
GFR calc non Af Amer: >60 mL/min (ref >60)
Glucose, Bld: 137 mg/dL — ABNORMAL HIGH (ref 65–99)
Potassium: 3.7 mmol/L (ref 3.5–5.1)
SODIUM: 137 mmol/L (ref 135–145)
TOTAL PROTEIN: 9.2 g/dL — AB (ref 6.5–8.1)
Total Bilirubin: 1.8 mg/dL — ABNORMAL HIGH (ref 0.3–1.2)

## 2016-11-04 LAB — CBC
HCT: 45.6 % (ref 39.0–52.0)
HEMOGLOBIN: 16.1 g/dL (ref 13.0–17.0)
MCH: 28.6 pg (ref 26.0–34.0)
MCHC: 35.3 g/dL (ref 30.0–36.0)
MCV: 81 fL (ref 78.0–100.0)
PLATELETS: 305 10*3/uL (ref 150–400)
RBC: 5.63 MIL/uL (ref 4.22–5.81)
RDW: 13.2 % (ref 11.5–15.5)
WBC: 11 10*3/uL — ABNORMAL HIGH (ref 4.0–10.5)

## 2016-11-04 LAB — LIPASE, BLOOD: Lipase: 20 U/L (ref 11–51)

## 2016-11-04 MED ORDER — ONDANSETRON HCL 8 MG PO TABS: 8.0000 mg | ORAL_TABLET | Freq: Three times a day (TID) | ORAL | 0 refills | Status: DC | PRN

## 2016-11-04 MED ORDER — ONDANSETRON 8 MG PO TBDP
8.0000 mg | ORAL_TABLET | Freq: Once | ORAL | Status: AC
  Administered 2016-11-04: 8 mg via ORAL
  Filled 2016-11-04: qty 1

## 2016-11-04 NOTE — ED Notes (Signed)
Pt states understanding of care given and follow up instructions.  PT a/o ambulated from ED with steady gait

## 2016-11-04 NOTE — ED Notes (Signed)
Patient no long in waiting room.

## 2016-11-04 NOTE — ED Triage Notes (Signed)
Report of vomiting and diarrhea that started this morning.

## 2016-11-04 NOTE — ED Provider Notes (Signed)
[facility]-EMERGENCY DEPT Provider Note   CSN: [id number] Arrival date & time: 11/04/16  1447     History   Chief Complaint Chief Complaint  Patient presents with  . Emesis  . Diarrhea    HPI Mario Nguyen is a 26 y.o. male.  He complains of diarrhea since yesterday, "like water", green color, and vomiting since early today- yellow to green.  No fever, chills, cough, shortness of breath, chest pain or weakness.  No known sick contacts.  He works as a Chief Strategy Officerretail clerk.  There are no other known modifying factors.  HPI  History reviewed. No pertinent past medical history.  Patient Active Problem List   Diagnosis Date Noted  . Routine general medical examination at a health care facility 08/03/2013  . Screening for STD (sexually transmitted disease) 08/03/2013  . Tobacco use disorder 08/03/2013  . Acne 08/03/2013    History reviewed. No pertinent surgical history.     Home Medications    Prior to Admission medications   Medication Sig Start Date End Date Taking? Authorizing Provider  ondansetron (ZOFRAN) 8 MG tablet Take 1 tablet (8 mg total) by mouth every 8 (eight) hours as needed for nausea or vomiting. 11/04/16   Mancel BaleWentz, Anyelo Mccue, MD    Family History Family History  Problem Relation Age of Onset  . Hypertension Mother   . Learning disabilities Maternal Grandmother   . Stroke Maternal Grandfather     Social History Social History  Substance Use Topics  . Smoking status: Current Every Day Smoker    Packs/day: 0.50    Types: Cigarettes  . Smokeless tobacco: Never Used  . Alcohol use Yes     Comment: occasional     Allergies   Patient has no known allergies.   Review of Systems Review of Systems  All other systems reviewed and are negative.    Physical Exam Updated Vital Signs BP 113/70   Pulse 78   Temp (!) 97.4 F (36.3 C) (Oral)   Resp 19   Ht 5\' 5"  (1.651 m)   Wt 68 kg (150 lb)   SpO2 99%   BMI 24.96 kg/m   Physical Exam    Constitutional: He is oriented to person, place, and time. He appears well-developed and well-nourished. No distress.  HENT:  Head: Normocephalic and atraumatic.  Right Ear: External ear normal.  Left Ear: External ear normal.  Eyes: Pupils are equal, round, and reactive to light. Conjunctivae and EOM are normal.  Neck: Normal range of motion and phonation normal. Neck supple.  Cardiovascular: Normal rate, regular rhythm and normal heart sounds.   Pulmonary/Chest: Effort normal and breath sounds normal. He exhibits no bony tenderness.  Abdominal: Soft. He exhibits no distension. There is no tenderness.  Musculoskeletal: Normal range of motion.  Neurological: He is alert and oriented to person, place, and time. No cranial nerve deficit or sensory deficit. He exhibits normal muscle tone. Coordination normal.  Skin: Skin is warm, dry and intact.  Psychiatric: He has a normal mood and affect. His behavior is normal. Judgment and thought content normal.  Nursing note and vitals reviewed.    ED Treatments / Results  Labs (all labs ordered are listed, but only abnormal results are displayed) Labs Reviewed  COMPREHENSIVE METABOLIC PANEL - Abnormal; Notable for the following:       Result Value   Chloride 97 (*)    Glucose, Bld 137 (*)    Total Protein 9.2 (*)    Albumin 5.2 (*)  Total Bilirubin 1.8 (*)    All other components within normal limits  CBC - Abnormal; Notable for the following:    WBC 11.0 (*)    All other components within normal limits  URINALYSIS, ROUTINE W REFLEX MICROSCOPIC - Abnormal; Notable for the following:    Color, Urine AMBER (*)    APPearance CLOUDY (*)    Specific Gravity, Urine 1.033 (*)    Bilirubin Urine SMALL (*)    Ketones, ur 20 (*)    Protein, ur 100 (*)    Squamous Epithelial / LPF 0-5 (*)    All other components within normal limits  LIPASE, BLOOD    EKG  EKG Interpretation None       Radiology No results  found.  Procedures Procedures (including critical care time)  Medications Ordered in ED Medications  ondansetron (ZOFRAN-ODT) disintegrating tablet 8 mg (8 mg Oral Given 11/04/16 1950)     Initial Impression / Assessment and Plan / ED Course  I have reviewed the triage vital signs and the nursing notes.  Pertinent labs & imaging results that were available during my care of the patient were reviewed by me and considered in my medical decision making (see chart for details).      Patient Vitals for the past 24 hrs:  BP Temp Temp src Pulse Resp SpO2 Height Weight  11/04/16 2100 113/70 - - 78 19 99 % - -  11/04/16 2030 114/79 - - 96 14 100 % - -  11/04/16 2000 121/71 - - 80 (!) 21 100 % - -  11/04/16 1821 (!) 109/93 (!) 97.4 F (36.3 C) Oral 87 17 100 % - -  11/04/16 1508 122/71 98 F (36.7 C) Oral 91 16 100 % 5\' 5"  (1.651 m) 68 kg (150 lb)    9:17 PM Reevaluation with update and discussion. After initial assessment and treatment, an updated evaluation reveals he has been able to tolerate 8 ounces of oral liquids without vomiting.  Findings discussed with the patient and all questions were answered. Kamesha Herne L    Final Clinical Impressions(s) / ED Diagnoses   Final diagnoses:  Nausea vomiting and diarrhea    Specific nausea vomiting diarrhea.  Suspect infectious process.  Doubt serious bacterial infection, metabolic instability or impending vascular collapse.  Nursing Notes Reviewed/ Care Coordinated Applicable Imaging Reviewed Interpretation of Laboratory Data incorporated into ED treatment  The patient appears reasonably screened and/or stabilized for discharge and I doubt any other medical condition or other [facility] requiring further screening, evaluation, or treatment in the ED at this time prior to discharge.  Plan: Home Medications-continue usual medications; Home Treatments-work release 3 days, gradually advance diet; return here if the recommended treatment, does  not improve the symptoms; Recommended follow up-PCP, as needed.   New Prescriptions New Prescriptions   ONDANSETRON (ZOFRAN) 8 MG TABLET    Take 1 tablet (8 mg total) by mouth every 8 (eight) hours as needed for nausea or vomiting.     Mancel Bale, MD 11/04/16 2118

## 2016-11-04 NOTE — ED Notes (Signed)
Patient seen walking out to car.

## 2016-11-04 NOTE — Discharge Instructions (Signed)
Start with a clear liquid diet and gradually advance to regular foods over the next 2 or 3 days.  Use Tylenol, as needed for pain or fever.  Return here, if needed, for problems.

## 2016-11-04 NOTE — ED Notes (Signed)
Pt able to drink 7.2 oz can of ginger ale with no increased nausea or vomiting

## 2016-11-04 NOTE — ED Notes (Signed)
Pt provided with ginger ale.  Instructed to take small sips

## 2018-02-22 ENCOUNTER — Emergency Department (HOSPITAL_COMMUNITY)
Admission: EM | Admit: 2018-02-22 | Discharge: 2018-02-22 | Disposition: A | Discharge status: Home / Self Care | Payer: Self-pay | Attending: Emergency Medicine | Admitting: Emergency Medicine

## 2018-02-22 ENCOUNTER — Encounter (HOSPITAL_COMMUNITY): Payer: Self-pay | Admitting: Emergency Medicine

## 2018-02-22 ENCOUNTER — Other Ambulatory Visit: Payer: Self-pay

## 2018-02-22 DIAGNOSIS — L600 Ingrowing nail: Secondary | ICD-10-CM | POA: Insufficient documentation

## 2018-02-22 DIAGNOSIS — F17210 Nicotine dependence, cigarettes, uncomplicated: Secondary | ICD-10-CM | POA: Insufficient documentation

## 2018-02-22 DIAGNOSIS — Z79899 Other long term (current) drug therapy: Secondary | ICD-10-CM | POA: Insufficient documentation

## 2018-02-22 MED ORDER — BACITRACIN ZINC 500 UNIT/GM EX OINT
TOPICAL_OINTMENT | Freq: Once | CUTANEOUS | Status: AC
  Administered 2018-02-22: 19:00:00 via TOPICAL
  Filled 2018-02-22: qty 0.9

## 2018-02-22 NOTE — Discharge Instructions (Signed)
Keep foot clean and dry Please follow up with podiatry if your toe is not improving

## 2018-02-22 NOTE — ED Triage Notes (Signed)
Patient reports ingrown toenail on his L great toe.

## 2018-02-22 NOTE — ED Provider Notes (Signed)
[facility] EMERGENCY DEPARTMENT Provider Note   CSN: [id number] Arrival date & time: 02/22/18  1653     History   Chief Complaint Chief Complaint  Patient presents with  . Ingrown Toenail    HPI Mario Nguyen is a 27 y.o. male who presents with an ingrown toenail. He states it's been hurting for the past three days. It hurts over the lateral nail fold of his left great toe. A small amount of pus came out of the area. It hasn't been improving so he decided to come to the ED. He keeps jamming it at the end of his shoe making it worse. Nothing makes it better.  HPI  History reviewed. No pertinent past medical history.  Patient Active Problem List   Diagnosis Date Noted  . Routine general medical examination at a health care facility 08/03/2013  . Screening for STD (sexually transmitted disease) 08/03/2013  . Tobacco use disorder 08/03/2013  . Acne 08/03/2013    History reviewed. No pertinent surgical history.      Home Medications    Prior to Admission medications   Medication Sig Start Date End Date Taking? Authorizing Provider  ondansetron (ZOFRAN) 8 MG tablet Take 1 tablet (8 mg total) by mouth every 8 (eight) hours as needed for nausea or vomiting. 11/04/16   Mancel Bale, MD    Family History Family History  Problem Relation Age of Onset  . Hypertension Mother   . Learning disabilities Maternal Grandmother   . Stroke Maternal Grandfather     Social History Social History   Tobacco Use  . Smoking status: Current Every Day Smoker    Packs/day: 0.50    Types: Cigarettes  . Smokeless tobacco: Never Used  Substance Use Topics  . Alcohol use: Yes    Comment: occasional  . Drug use: No     Allergies   Patient has no known allergies.   Review of Systems Review of Systems  Musculoskeletal: Positive for arthralgias.  Skin: Positive for wound.     Physical Exam Updated Vital Signs BP 131/73 (BP Location: Right Arm)   Pulse 75   Temp  98.6 F (37 C) (Oral)   Ht 5\' 5"  (1.651 m)   Wt 68 kg   SpO2 100%   BMI 24.96 kg/m   Physical Exam  Constitutional: He is oriented to person, place, and time. He appears well-developed and well-nourished. No distress.  HENT:  Head: Normocephalic and atraumatic.  Eyes: Pupils are equal, round, and reactive to light. Conjunctivae are normal. Right eye exhibits no discharge. Left eye exhibits no discharge. No scleral icterus.  Neck: Normal range of motion.  Cardiovascular: Normal rate.  Pulmonary/Chest: Effort normal. No respiratory distress.  Abdominal: He exhibits no distension.  Musculoskeletal:  Left great toe: Redness and tenderness over left lateral nail fold due to ingrown toenail  Neurological: He is alert and oriented to person, place, and time.  Skin: Skin is warm and dry.  Psychiatric: He has a normal mood and affect. His behavior is normal.  Nursing note and vitals reviewed.    ED Treatments / Results  Labs (all labs ordered are listed, but only abnormal results are displayed) Labs Reviewed - No data to display  EKG None  Radiology No results found.  Procedures Procedures (including critical care time)  Medications Ordered in ED Medications - No data to display   Initial Impression / Assessment and Plan / ED Course  I have reviewed the triage vital signs and  the nursing notes.  Pertinent labs & imaging results that were available during my care of the patient were reviewed by me and considered in my medical decision making (see chart for details).  27 year old male with ingrown toenail. There is a local infection of the area. The wound was cleaned and tape was used to pull the skin away from the nail fold. A small amount of pus was expressed. Bacitracin was applied as well as a bandage. He was instructed to f/u with podiatry if he was not improving.  Final Clinical Impressions(s) / ED Diagnoses   Final diagnoses:  Ingrown toenail    ED Discharge  Orders    None       Bethel Born, PA-C 02/22/18 1901    Samuel Jester, DO 02/23/18 1530

## 2018-04-10 ENCOUNTER — Encounter (HOSPITAL_COMMUNITY): Payer: Self-pay | Admitting: Emergency Medicine

## 2018-04-10 ENCOUNTER — Emergency Department (HOSPITAL_COMMUNITY)
Admission: EM | Admit: 2018-04-10 | Discharge: 2018-04-10 | Disposition: A | Discharge status: Home / Self Care | Payer: Self-pay | Attending: Emergency Medicine | Admitting: Emergency Medicine

## 2018-04-10 ENCOUNTER — Other Ambulatory Visit: Payer: Self-pay

## 2018-04-10 ENCOUNTER — Emergency Department (HOSPITAL_COMMUNITY): Payer: Self-pay

## 2018-04-10 DIAGNOSIS — F17210 Nicotine dependence, cigarettes, uncomplicated: Secondary | ICD-10-CM | POA: Insufficient documentation

## 2018-04-10 DIAGNOSIS — J069 Acute upper respiratory infection, unspecified: Secondary | ICD-10-CM | POA: Insufficient documentation

## 2018-04-10 MED ORDER — GUAIFENESIN ER 600 MG PO TB12: 600.0000 mg | ORAL_TABLET | Freq: Two times a day (BID) | ORAL | 0 refills | Status: DC | PRN

## 2018-04-10 MED ORDER — BENZONATATE 100 MG PO CAPS: 100.0000 mg | ORAL_CAPSULE | Freq: Three times a day (TID) | ORAL | 0 refills | Status: DC | PRN

## 2018-04-10 NOTE — ED Triage Notes (Signed)
Pt reports sore throat and congestion for a week with no fever.

## 2018-04-10 NOTE — ED Provider Notes (Signed)
[facility] EMERGENCY DEPARTMENT Provider Note   CSN: [id number] Arrival date & time: 04/10/18  [id number]     History   Chief Complaint Chief Complaint  Patient presents with  . Sore Throat    HPI Jianni L Pastorino is a 27 y.o. male.  HPI  27 year old male presents with cough for about 1 week.  Mostly nonproductive but occasionally has some green sputum.  He has not been having any fevers but has been having sore throats.  He feels like he is lost his voice a little bit.  Has some nasal congestion as well.  He is tried over-the-counter allergy medicine, NyQuil, and cough drops.  However the cough is keeping him up at night.  No vomiting.  He is a smoker.  History reviewed. No pertinent past medical history.  Patient Active Problem List   Diagnosis Date Noted  . Routine general medical examination at a health care facility 08/03/2013  . Screening for STD (sexually transmitted disease) 08/03/2013  . Tobacco use disorder 08/03/2013  . Acne 08/03/2013    History reviewed. No pertinent surgical history.      Home Medications    Prior to Admission medications   Medication Sig Start Date End Date Taking? Authorizing Provider  benzonatate (TESSALON) 100 MG capsule Take 1 capsule (100 mg total) by mouth 3 (three) times daily as needed for cough. 04/10/18   Pricilla LovelessGoldston, Tigerlily Christine, MD  guaiFENesin (MUCINEX) 600 MG 12 hr tablet Take 1 tablet (600 mg total) by mouth 2 (two) times daily as needed for cough or to loosen phlegm. 04/10/18   Pricilla LovelessGoldston, Jebidiah Baggerly, MD  ondansetron (ZOFRAN) 8 MG tablet Take 1 tablet (8 mg total) by mouth every 8 (eight) hours as needed for nausea or vomiting. 11/04/16   Mancel BaleWentz, Elliott, MD    Family History Family History  Problem Relation Age of Onset  . Hypertension Mother   . Learning disabilities Maternal Grandmother   . Stroke Maternal Grandfather     Social History Social History   Tobacco Use  . Smoking status: Current Every Day Smoker    Packs/day:  0.50    Types: Cigarettes  . Smokeless tobacco: Never Used  Substance Use Topics  . Alcohol use: Yes    Comment: occasional  . Drug use: No     Allergies   Patient has no known allergies.   Review of Systems Review of Systems  Constitutional: Negative for fever.  HENT: Positive for congestion, sore throat and voice change.   Respiratory: Positive for cough. Negative for shortness of breath.   All other systems reviewed and are negative.    Physical Exam Updated Vital Signs BP 127/72 (BP Location: Right Arm)   Pulse 86   Temp 98.2 F (36.8 C) (Oral)   Resp 18   Ht 5\' 5"  (1.651 m)   Wt 67.1 kg   SpO2 100%   BMI 24.63 kg/m   Physical Exam Vitals signs and nursing note reviewed.  Constitutional:      Appearance: He is well-developed.  HENT:     Head: Normocephalic and atraumatic.     Right Ear: External ear normal.     Left Ear: External ear normal.     Nose: Nose normal.     Mouth/Throat:     Tonsils: No tonsillar exudate.  Eyes:     General:        Right eye: No discharge.        Left eye: No discharge.  Neck:  Musculoskeletal: Normal range of motion and neck supple.  Cardiovascular:     Rate and Rhythm: Normal rate and regular rhythm.     Heart sounds: Normal heart sounds.  Pulmonary:     Effort: Pulmonary effort is normal. No tachypnea or accessory muscle usage.     Breath sounds: Normal breath sounds. No decreased breath sounds, wheezing, rhonchi or rales.  Lymphadenopathy:     Cervical: No cervical adenopathy.  Skin:    General: Skin is warm and dry.  Neurological:     Mental Status: He is alert.  Psychiatric:        Mood and Affect: Mood is not anxious.      ED Treatments / Results  Labs (all labs ordered are listed, but only abnormal results are displayed) Labs Reviewed - No data to display  EKG None  Radiology Dg Chest 2 View  Result Date: 04/10/2018 CLINICAL DATA:  Cough for 1 week. EXAM: CHEST - 2 VIEW COMPARISON:   12/07/2012 FINDINGS: Heart size and mediastinal contours are within normal limits. Both lungs are clear. No evidence of pleural effusion. No other significant abnormality identified. IMPRESSION: Negative.  No active cardiopulmonary disease. Electronically Signed   By: Myles RosenthalJohn  Stahl M.D.   On: 04/10/2018 08:55    Procedures Procedures (including critical care time)  Medications Ordered in ED Medications - No data to display   Initial Impression / Assessment and Plan / ED Course  I have reviewed the triage vital signs and the nursing notes.  Pertinent labs & imaging results that were available during my care of the patient were reviewed by me and considered in my medical decision making (see chart for details).     Patient likely has a viral URI.  He is well-appearing.  No signs or symptoms of impending airway compromise or deep space infection.  No obvious pneumonia.  Think is reasonable to treat his cough with antitussives and have him follow-up with PCP.  Final Clinical Impressions(s) / ED Diagnoses   Final diagnoses:  Upper respiratory tract infection, unspecified type    ED Discharge Orders         Ordered    benzonatate (TESSALON) 100 MG capsule  3 times daily PRN     04/10/18 0908    guaiFENesin (MUCINEX) 600 MG 12 hr tablet  2 times daily PRN     04/10/18 0908           Pricilla LovelessGoldston, Jaramiah Bossard, MD 04/10/18 [phone]

## 2018-04-10 NOTE — Discharge Instructions (Addendum)
If you develop high fever, vomiting, trouble breathing, coughing blood, or any other new/concerning symptoms then return to the ER for evaluation. °

## 2018-09-28 ENCOUNTER — Ambulatory Visit (INDEPENDENT_AMBULATORY_CARE_PROVIDER_SITE_OTHER): Payer: 59 | Admitting: Family Medicine

## 2018-09-28 ENCOUNTER — Encounter (INDEPENDENT_AMBULATORY_CARE_PROVIDER_SITE_OTHER): Payer: Self-pay

## 2018-09-28 ENCOUNTER — Other Ambulatory Visit: Payer: Self-pay

## 2018-09-28 ENCOUNTER — Encounter: Payer: Self-pay | Admitting: Family Medicine

## 2018-09-28 VITALS — BP 116/64 | HR 74 | Temp 98.6°F | Resp 12 | Ht 65.0 in | Wt 152.0 lb

## 2018-09-28 DIAGNOSIS — F1290 Cannabis use, unspecified, uncomplicated: Secondary | ICD-10-CM | POA: Diagnosis not present

## 2018-09-28 DIAGNOSIS — F17210 Nicotine dependence, cigarettes, uncomplicated: Secondary | ICD-10-CM

## 2018-09-28 NOTE — Patient Instructions (Addendum)
Thank you for coming into the office today. I appreciate the opportunity to provide you with the care for your health and wellness.  Great to meet you today!   Today we discussed: overall health and establishment of care  Please follow up in 9 months for an annual visit. If you need me before then just reach out.   Please get set up with a eye dr and dentist.  Continue water intake. Eat a well-balanced: eat the rainbow if it is colorful it is most likely good for you.  Please consider reducing your smoking by one cigarette a day.   Please be careful and stay safe. Please know I am here for you.  Please continue to practice social distancing to keep you, your family, and our community safe.  WASH YOUR HANDS WELL AND FREQUENTLY. AVOID TOUCHING YOUR FACE, UNLESS YOUR HANDS ARE FRESHLY WASHED.  GET FRESH AIR DAILY. STAY HYDRATED WITH WATER.   It was a pleasure to see you and I look forward to continuing to work together on your health and well-being. Please do not hesitate to call the office if you need care or have questions about your care.  Have a wonderful day and week. With Gratitude, Tereasa CoopHannah Marquize Seib, DNP, AGNP-BC   Please think about quitting smoking.  This is very important for your health.  Consider setting a quit date, then cutting back or switching brands to prepare to stop.  Also think of the money you will save every day by not smoking.  Quick Tips to Quit Smoking: . Fix a date i.e. keep a date in mind from when you would not touch a tobacco product to smoke  . Keep yourself busy and block your mind with work loads or reading books or watching movies in malls where smoking is not allowed  . Vanish off the things which reminds you about smoking for example match box, or your favorite lighter, or the pipe you used for smoking, or your favorite jeans and shirt with which you used to enjoy smoking, or the club where you used to do smoking  . Try to avoid certain people  places and incidences where and with whom smoking is a common factor to add on  . Praise yourself with some token gifts from the money you saved by stopping smoking  . Anti Smoking teams are there to help you. Join their programs  . Anti-smoking Gums are there in many medical shops. Try them to quit smoking   Side-effects of Smoking: . Disease caused by smoking cigarettes are emphysema, bronchitis, heart failures  . Premature death  . Cancer is the major side effect of smoking  . Heart attacks and strokes are the quick effects of smoking causing sudden death  . Some smokers lives end up with limbs amputated  . Breathing problem or fast breathing is another side effect of smoking  . Due to more intakes of smokes, carbon mono-oxide goes into your brain and other muscles of the body which leads to swelling of the veins and blockage to the air passage to lungs  . Carbon monoxide blocks blood vessels which leads to blockage in the flow of blood to different major body organs like heart lungs and thus leads to attacks and deaths  . During pregnancy smoking is very harmful and leads to premature birth of the infant, spontaneous abortions, low weight of the infant during birth  . Fat depositions to narrow and blocked blood vessels causing heart  attacks  . In many cases cigarette smoking caused infertility in men

## 2018-09-28 NOTE — Progress Notes (Signed)
Subjective:     Patient ID: Mario Nguyen, male   DOB: 01/14/1991, 28 y.o.   MRN: [id number]  Mario Nguyen presents for New Patient (Initial Visit) (establish care)  [name] is a 28 year old male patient who was previously seen at [facility].  Prior to losing his insurance.  He reports that now that he has insurance back there are no longer accepting patients back.  History includes nicotine dependence and acne.   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  He reports that he is sleeping frequently more often.  Has trouble sleeping when he gets off work at times.  Or will oversleep.  Because he is so tired.  Currently he is an every day smoker of cigarettes.  Smokes half a pack a day he has been smoking for about 6 years.  He has never used smokeless tobacco.  He drinks alcohol occasionally but not frequently enough to comment that he drinks weekly.  Does admit that he does smoke marijuana every day.  Is sexually active is in a committed relationship of 8 years.  Currently living with his fiance Fritz Pickerelresean.  They have a dog named stormy who is 27 years old.  He reports that he likes to garden and listen to music and relaxing his free time.  He reports that he thinks he eats a well-balanced diet but he does enjoy sweets.  He does not drink too much tea sodas or coffee.  But he does like to drink Gatorade and water.  Reports that he wears a seatbelt does not wear sun protection.  And occasionally does use his phone while he is driving.  He is all up-to-date on all of his immunizations and his vaccines.  He has no outstanding health care gaps.  Reports that he needs to get set up with a dentist.  Has not had his eyes checked either.  Does not wear glasses or have vision trouble that he knows of.   Today in the office he denies having fevers, chills, cough, shortness of breath, chest pain, leg swelling, palpitations, vision changes, dizziness, headaches or any other  difficulties or signs or symptoms of infection or high blood pressure.  Blood pressure is controlled.  Weight is a little bit elevated with overweight of a BMI of 25.30.  Does report that he has some sleep difficulties as listed above.  Secondary to night shift work.   Past Medical, Surgical, Social History, Allergies, and Medications have been Reviewed.  History reviewed. No pertinent past medical history. History reviewed. No pertinent surgical history. Social History   Socioeconomic History  . Marital status: Single    Spouse name: Not on file  . Number of children: Not on file  . Years of education: 1110  . Highest education level: 10th grade  Occupational History  . Not on file  Social Needs  . Financial resource strain: Not hard at all  . Food insecurity:    Worry: Never true    Inability: Never true  . Transportation needs:    Medical: No    Non-medical: No  Tobacco Use  . Smoking status: Current Every Day Smoker    Packs/day: 0.50    Years: 6.00    Pack years: 3.00    Types: Cigarettes  . Smokeless tobacco: Never Used  Substance and Sexual Activity  . Alcohol use: Yes    Comment: occasional  . Drug use: Yes    Frequency: 7.0 times  per week    Types: Marijuana  . Sexual activity: Yes    Birth control/protection: None  Lifestyle  . Physical activity:    Days per week: 1 day    Minutes per session: 30 min  . Stress: To some extent  Relationships  . Social connections:    Talks on phone: Once a week    Gets together: More than three times a week    Attends religious service: Never    Active member of club or organization: No    Attends meetings of clubs or organizations: Never    Relationship status: Never married  . Intimate partner violence:    Fear of current or ex partner: No    Emotionally abused: No    Physically abused: No    Forced sexual activity: No  Other Topics Concern  . Not on file  Social History Narrative   Lives fiance Fritz Pickerelresean 8 years     Dog: [name] y 3 years      Enjoys gardening, listening to music, just relaxing.      Diet: Well balanced, but does like to eat sweets.   Caffeine: Does not drink much tea or sodas, does not drink coffee, most drinks Gatorade.    Water: 3-5 bottles of water daily      Wears seat belt   Does not wear sun protection   Smoke detectors   Occasional use of phone while driving    Outpatient Encounter Medications as of 09/28/2018  Medication Sig  . [DISCONTINUED] benzonatate (TESSALON) 100 MG capsule Take 1 capsule (100 mg total) by mouth 3 (three) times daily as needed for cough. (Patient not taking: Reported on 09/28/2018)  . [DISCONTINUED] guaiFENesin (MUCINEX) 600 MG 12 hr tablet Take 1 tablet (600 mg total) by mouth 2 (two) times daily as needed for cough or to loosen phlegm. (Patient not taking: Reported on 09/28/2018)  . [DISCONTINUED] ondansetron (ZOFRAN) 8 MG tablet Take 1 tablet (8 mg total) by mouth every 8 (eight) hours as needed for nausea or vomiting. (Patient not taking: Reported on 09/28/2018)   No facility-administered encounter medications on file as of 09/28/2018.    No Known Allergies  Review of Systems  Constitutional: Negative for chills and fever.  HENT: Negative.   Eyes: Negative.  Negative for visual disturbance.  Respiratory: Negative.  Negative for cough and shortness of breath.   Cardiovascular: Negative.  Negative for chest pain, palpitations and leg swelling.  Gastrointestinal: Negative.   Endocrine: Negative.   Genitourinary: Negative.   Musculoskeletal: Negative.   Skin: Negative.   Allergic/Immunologic: Negative.   Neurological: Negative.  Negative for dizziness and headaches.  Hematological: Negative.   Psychiatric/Behavioral: Positive for sleep disturbance.  All other systems reviewed and are negative.      Objective:     BP 116/64   Pulse 74   Temp 98.6 F (37 C) (Oral)   Resp 12   Ht 5\' 5"  (1.651 m)   Wt 152 lb 0.6 oz (69 kg)   SpO2 97%    BMI 25.30 kg/m   Physical Exam Vitals signs and nursing note reviewed.  Constitutional:      Appearance: Normal appearance. He is well-developed, well-groomed and overweight.  HENT:     Head: Normocephalic and atraumatic.     Right Ear: External ear normal.     Left Ear: External ear normal.     Nose: Nose normal.  Eyes:     General: No scleral icterus.  Right eye: No discharge.        Left eye: No discharge.     Conjunctiva/sclera: Conjunctivae normal.  Neck:     Musculoskeletal: Normal range of motion and neck supple.  Cardiovascular:     Rate and Rhythm: Normal rate and regular rhythm.     Pulses: Normal pulses.     Heart sounds: Normal heart sounds.  Abdominal:     General: Bowel sounds are normal.  Musculoskeletal: Normal range of motion.  Skin:    General: Skin is warm and dry.  Neurological:     Mental Status: He is alert and oriented to person, place, and time.  Psychiatric:        Attention and Perception: Attention and perception normal.        Mood and Affect: Mood and affect normal.        Speech: Speech normal.        Behavior: Behavior normal. Behavior is cooperative.        Thought Content: Thought content normal.        Cognition and Memory: Cognition and memory normal.        Judgment: Judgment normal.        Assessment and Plan        1. Cigarette nicotine dependence without complication Asked about quitting: confirms they are currently smokes cigarettes Advise to quit smoking: Educated about QUITTING to reduce the risk of cancer, cardio and cerebrovascular disease. Assess willingness: Unwilling to quit at this time, but is working on cutting back. Assist with counseling and pharmacotherapy: Counseled for 5 minutes and literature provided. Arrange for follow up: If not quitting follow up in 3 months and continue to offer help.    2. Marijuana smoker Reported per pt that he smokes daily. Does not smoke and drive or smoke before work. He  reports that he does not really have intentions to quit smoking but is open to information and help.    Follow Up: 9 months for annual visit.       Perlie Mayo, DNP, AGNP-BC [address], [place], [place] [address] Office Hours: Mon-Thurs 8 am-5 pm; Fri 8 am-12 pm Office Phone:  [phone]  Office Fax: [phone]

## 2019-01-18 ENCOUNTER — Other Ambulatory Visit: Payer: Self-pay

## 2019-01-18 ENCOUNTER — Ambulatory Visit (INDEPENDENT_AMBULATORY_CARE_PROVIDER_SITE_OTHER): Payer: 59 | Admitting: Family Medicine

## 2019-01-18 ENCOUNTER — Encounter: Payer: Self-pay | Admitting: Family Medicine

## 2019-01-18 ENCOUNTER — Ambulatory Visit: Payer: 59 | Admitting: Family Medicine

## 2019-01-18 VITALS — BP 120/52 | HR 87 | Temp 98.6°F | Resp 12 | Ht 65.0 in | Wt 149.1 lb

## 2019-01-18 DIAGNOSIS — R220 Localized swelling, mass and lump, head: Secondary | ICD-10-CM

## 2019-01-18 DIAGNOSIS — S0512XA Contusion of eyeball and orbital tissues, left eye, initial encounter: Secondary | ICD-10-CM | POA: Diagnosis not present

## 2019-01-18 DIAGNOSIS — R200 Anesthesia of skin: Secondary | ICD-10-CM | POA: Diagnosis not present
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
## 2019-01-24 ENCOUNTER — Other Ambulatory Visit: Payer: Self-pay

## 2019-01-24 ENCOUNTER — Emergency Department (HOSPITAL_COMMUNITY)
Admission: EM | Admit: 2019-01-24 | Discharge: 2019-01-24 | Disposition: A | Discharge status: Home / Self Care | Payer: 59 | Attending: Emergency Medicine | Admitting: Emergency Medicine

## 2019-01-24 ENCOUNTER — Emergency Department (HOSPITAL_COMMUNITY): Payer: 59

## 2019-01-24 ENCOUNTER — Encounter (HOSPITAL_COMMUNITY): Payer: Self-pay

## 2019-01-24 DIAGNOSIS — Y9389 Activity, other specified: Secondary | ICD-10-CM | POA: Insufficient documentation

## 2019-01-24 DIAGNOSIS — X58XXXA Exposure to other specified factors, initial encounter: Secondary | ICD-10-CM | POA: Insufficient documentation

## 2019-01-24 DIAGNOSIS — Y999 Unspecified external cause status: Secondary | ICD-10-CM | POA: Insufficient documentation

## 2019-01-24 DIAGNOSIS — S0093XA Contusion of unspecified part of head, initial encounter: Secondary | ICD-10-CM | POA: Insufficient documentation

## 2019-01-24 DIAGNOSIS — S0990XA Unspecified injury of head, initial encounter: Secondary | ICD-10-CM | POA: Diagnosis present

## 2019-01-24 DIAGNOSIS — F17210 Nicotine dependence, cigarettes, uncomplicated: Secondary | ICD-10-CM | POA: Diagnosis not present

## 2019-01-24 DIAGNOSIS — R202 Paresthesia of skin: Secondary | ICD-10-CM | POA: Diagnosis not present

## 2019-01-24 DIAGNOSIS — Y929 Unspecified place or not applicable: Secondary | ICD-10-CM | POA: Diagnosis not present
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
## 2019-05-02 ENCOUNTER — Ambulatory Visit: Payer: 59 | Attending: Internal Medicine

## 2019-05-02 ENCOUNTER — Other Ambulatory Visit: Payer: Self-pay

## 2019-05-02 DIAGNOSIS — Z20822 Contact with and (suspected) exposure to covid-19: Secondary | ICD-10-CM

## 2019-05-03 ENCOUNTER — Other Ambulatory Visit: Payer: 59

## 2019-05-03 ENCOUNTER — Ambulatory Visit: Payer: 59 | Attending: Internal Medicine

## 2019-05-03 LAB — NOVEL CORONAVIRUS, NAA: SARS-CoV-2, NAA: NOT DETECTED

## 2019-06-28 ENCOUNTER — Ambulatory Visit (INDEPENDENT_AMBULATORY_CARE_PROVIDER_SITE_OTHER): Payer: 59 | Admitting: Family Medicine

## 2019-06-28 ENCOUNTER — Other Ambulatory Visit: Payer: Self-pay

## 2019-06-28 ENCOUNTER — Other Ambulatory Visit (HOSPITAL_COMMUNITY)
Admission: RE | Admit: 2019-06-28 | Discharge: 2019-06-28 | Disposition: A | Discharge status: Home / Self Care | Payer: 59 | Source: Ambulatory Visit | Attending: Family Medicine | Admitting: Family Medicine

## 2019-06-28 ENCOUNTER — Encounter: Payer: Self-pay | Admitting: Family Medicine

## 2019-06-28 VITALS — BP 102/70 | HR 76 | Temp 98.7°F | Resp 16 | Ht 65.0 in | Wt 154.0 lb

## 2019-06-28 DIAGNOSIS — Z113 Encounter for screening for infections with a predominantly sexual mode of transmission: Secondary | ICD-10-CM | POA: Insufficient documentation

## 2019-06-28 DIAGNOSIS — Z0001 Encounter for general adult medical examination with abnormal findings: Secondary | ICD-10-CM

## 2019-06-28 DIAGNOSIS — Z114 Encounter for screening for human immunodeficiency virus [HIV]: Secondary | ICD-10-CM | POA: Diagnosis not present

## 2019-06-28 DIAGNOSIS — Z1159 Encounter for screening for other viral diseases: Secondary | ICD-10-CM | POA: Insufficient documentation

## 2019-06-28 DIAGNOSIS — F17200 Nicotine dependence, unspecified, uncomplicated: Secondary | ICD-10-CM | POA: Diagnosis not present

## 2019-06-28 DIAGNOSIS — L708 Other acne: Secondary | ICD-10-CM

## 2019-06-28 DIAGNOSIS — M25561 Pain in right knee: Secondary | ICD-10-CM

## 2019-06-28 HISTORY — DX: Encounter for screening for other viral diseases: Z11.59

## 2019-06-28 NOTE — Assessment & Plan Note (Signed)
[place] taskforce rec. Also recent exposure.

## 2019-06-28 NOTE — Progress Notes (Signed)
Mario Nguyen is a 29 y.o. male who presents for annual wellness visit and follow-up on chronic medical conditions.  He has the following concerns: none   Immunization History  Administered Date(s) Administered  . DTaP 09/26/1994  . HiB (PRP-OMP) 09/26/1994  . IPV 09/26/1994  . MMR 09/26/1994  . Tdap 08/03/2013   Last colonoscopy: n/a Last PSA: n/a Dentist: needs to get seen Ophtho: doesn't see one Exercise: 2-3 days a week, push ups, gym, run.  End of Life Discussion:  Patient does not have a living will and medical power of attorney  The patient denies anorexia, fever, weight changes, headaches,  vision loss, decreased hearing, ear pain, hoarseness, chest pain, palpitations, dizziness, syncope, dyspnea on exertion, cough, swelling, nausea, vomiting, diarrhea, constipation, abdominal pain, melena, hematochezia, indigestion/heartburn, hematuria, incontinence, erectile dysfunction, nocturia, weakened urine stream, dysuria, genital lesions, joint pains, numbness, tingling, weakness, tremor, suspicious skin lesions, depression, anxiety, abnormal bleeding/bruising, or enlarged lymph nodes  Right knee: started giving out on him "off and on for a while" maybe 7 months. No pain, no swelling, it will just give out, no popping, clicking, no trauma or injury. Does not bother him much, does happen a few times a week. No trouble with ROM.  Past Medical, Surgical, Social History, Allergies, and Medications have been Reviewed. Today patient denies signs and symptoms of COVID 19 infection including fever, chills, cough, shortness of breath, and headache.   PHYSICAL EXAM:  BP 102/70   Pulse 76   Temp 98.7 F (37.1 C) (Temporal)   Resp 16   Ht _0  (1.651 m)   Wt 154 lb (69.9 kg)   SpO2 96%   BMI 25.63 kg/m   Physical Exam Vitals and nursing note reviewed.  Constitutional:      Appearance: Normal appearance. He is well-developed, well-groomed and normal weight.  HENT:     Head:  Normocephalic and atraumatic.     Right Ear: Tympanic membrane, ear canal and external ear normal.     Left Ear: Tympanic membrane, ear canal and external ear normal.     Mouth/Throat:     Comments: Mask in place  Eyes:     General:        Right eye: No discharge.        Left eye: No discharge.     Conjunctiva/sclera: Conjunctivae normal.  Cardiovascular:     Rate and Rhythm: Normal rate and regular rhythm.     Pulses: Normal pulses.     Heart sounds: Normal heart sounds.  Pulmonary:     Effort: Pulmonary effort is normal.     Breath sounds: Normal breath sounds.  Musculoskeletal:        General: Normal range of motion.     Cervical back: Normal range of motion and neck supple.  Skin:    General: Skin is warm.  Neurological:     General: No focal deficit present.     Mental Status: He is alert and oriented to person, place, and time.     Cranial Nerves: Cranial nerves are intact.     Sensory: Sensation is intact.     Motor: Motor function is intact.     Coordination: Coordination is intact.     Gait: Gait is intact.  Psychiatric:        Attention and Perception: Attention and perception normal.        Mood and Affect: Mood and affect normal.        Speech: Speech normal.  Behavior: Behavior normal. Behavior is cooperative.        Thought Content: Thought content normal.        Cognition and Memory: Cognition and memory normal.        Judgment: Judgment normal.     ASSESSMENT/PLAN:  1. Tobacco use disorder   2. Screening for STD (sexually transmitted disease)  - HSV 2 antibody, IgG - HEP C AB W/REFL - Urine cytology ancillary only  3. Other acne   4. Encounter for screening for human immunodeficiency virus (HIV)  - HIV Antibody (routine testing w rflx)  5. Encounter for hepatitis C screening test for low risk patient  - HEP C AB W/REFL  6. Annual visit for general adult medical examination with abnormal findings  - CBC - COMPLETE METABOLIC PANEL  WITH GFR    The patient's weight, height, BMI, and visual acuity have been recorded in the chart.  I have made referrals, counseling, and provided education to the patient based on review of the above and I have provided the patient with a written personalized care plan for preventive services.     Perlie Mayo, NP   06/28/2019

## 2019-06-28 NOTE — Assessment & Plan Note (Signed)
Reports intermittent off and on knee "giving out" denies having any other issues with it does not happen frequently enough to cause pain nor does he feel like he can referral at this time.  We will continue to monitor

## 2019-06-28 NOTE — Assessment & Plan Note (Signed)
recommended at least 30 minutes of aerobic activity at least 5 days/week; proper sunscreen use reviewed; healthy diet and alcohol recommendations (less than or equal to 2 drinks/day) reviewed; regular seatbelt use; changing batteries in smoke detectors. Immunization recommendations discussed.

## 2019-06-28 NOTE — Assessment & Plan Note (Signed)
[place] task force recommendation also recent exposure

## 2019-06-28 NOTE — Patient Instructions (Signed)
I appreciate the opportunity to provide you with care for your health and wellness. Today we discussed: overall health   Follow up: 1 year for annual with vision   Labs   Oil Cleansing Method for Acne   Please continue to practice social distancing to keep you, your family, and our community safe.  If you must go out, please wear a mask and practice good handwashing.  It was a pleasure to see you and I look forward to continuing to work together on your health and well-being. Please do not hesitate to call the office if you need care or have questions about your care.  Have a wonderful day and week. With Gratitude, Tereasa Coop, DNP, AGNP-BC

## 2019-06-28 NOTE — Assessment & Plan Note (Signed)
On protected intercourse single times over the last month, would like screening.

## 2019-06-28 NOTE — Assessment & Plan Note (Signed)
Asked about quitting: confirms they are currently smokes cigarettes Advise to quit smoking: Educated about QUITTING to reduce the risk of cancer, cardio and cerebrovascular disease. Assess willingness: Unwilling to quit at this time, but is working on cutting back. Assist with counseling and pharmacotherapy: Counseled for 5 minutes and literature provided. Arrange for follow up:  not quitting follow up in 3 months and continue to offer help.   

## 2019-06-28 NOTE — Assessment & Plan Note (Addendum)
Educated on oil cleansing method.

## 2019-07-04 LAB — URINE CYTOLOGY ANCILLARY ONLY
Bacterial Vaginitis-Urine: NEGATIVE
Candida Urine: NEGATIVE
Chlamydia: NEGATIVE
Comment: NEGATIVE
Comment: NEGATIVE
Comment: NORMAL
Neisseria Gonorrhea: NEGATIVE
Trichomonas: NEGATIVE

## 2020-01-03 ENCOUNTER — Other Ambulatory Visit: Payer: Self-pay

## 2020-01-03 ENCOUNTER — Other Ambulatory Visit: Payer: 59

## 2020-01-03 DIAGNOSIS — Z20822 Contact with and (suspected) exposure to covid-19: Secondary | ICD-10-CM

## 2020-01-05 LAB — SPECIMEN STATUS REPORT

## 2020-01-05 LAB — NOVEL CORONAVIRUS, NAA: SARS-CoV-2, NAA: NOT DETECTED

## 2020-01-05 LAB — SARS-COV-2, NAA 2 DAY TAT

## 2020-06-27 ENCOUNTER — Encounter: Payer: 59 | Admitting: Family Medicine

## 2020-07-13 ENCOUNTER — Encounter: Payer: 59 | Admitting: Internal Medicine

## 2020-08-01 ENCOUNTER — Other Ambulatory Visit: Payer: Self-pay

## 2020-08-01 ENCOUNTER — Encounter: Payer: Self-pay | Admitting: Internal Medicine

## 2020-08-01 ENCOUNTER — Ambulatory Visit (INDEPENDENT_AMBULATORY_CARE_PROVIDER_SITE_OTHER): Payer: BC Managed Care – PPO | Admitting: Internal Medicine

## 2020-08-01 VITALS — BP 134/69 | HR 78 | Temp 98.5°F | Ht 65.0 in | Wt 144.0 lb

## 2020-08-01 DIAGNOSIS — Z1159 Encounter for screening for other viral diseases: Secondary | ICD-10-CM

## 2020-08-01 DIAGNOSIS — Z0001 Encounter for general adult medical examination with abnormal findings: Secondary | ICD-10-CM | POA: Diagnosis not present

## 2020-08-01 DIAGNOSIS — F321 Major depressive disorder, single episode, moderate: Secondary | ICD-10-CM | POA: Diagnosis not present

## 2020-08-01 DIAGNOSIS — L304 Erythema intertrigo: Secondary | ICD-10-CM | POA: Diagnosis not present

## 2020-08-01 DIAGNOSIS — Z113 Encounter for screening for infections with a predominantly sexual mode of transmission: Secondary | ICD-10-CM

## 2020-08-01 DIAGNOSIS — F17200 Nicotine dependence, unspecified, uncomplicated: Secondary | ICD-10-CM

## 2020-08-01 DIAGNOSIS — Z114 Encounter for screening for human immunodeficiency virus [HIV]: Secondary | ICD-10-CM

## 2020-08-01 DIAGNOSIS — K649 Unspecified hemorrhoids: Secondary | ICD-10-CM | POA: Diagnosis not present

## 2020-08-01 MED ORDER — HYDROCORTISONE (PERIANAL) 2.5 % EX CREA: 1.0000 "application " | TOPICAL_CREAM | Freq: Two times a day (BID) | CUTANEOUS | 0 refills | Status: DC

## 2020-08-01 MED ORDER — BUPROPION HCL ER (SR) 150 MG PO TB12: 150.0000 mg | ORAL_TABLET | Freq: Two times a day (BID) | ORAL | 2 refills | Status: DC

## 2020-08-01 MED ORDER — KETOCONAZOLE 2 % EX CREA: 1.0000 "application " | TOPICAL_CREAM | Freq: Every day | CUTANEOUS | 0 refills | Status: DC

## 2020-08-01 NOTE — Assessment & Plan Note (Signed)
Unprotected intercourse history Advised to use barrier contraceptives

## 2020-08-01 NOTE — Assessment & Plan Note (Signed)
Smokes about 1 pack/day  Asked about quitting: confirms that he currently smokes cigarettes Advise to quit smoking: Educated about QUITTING to reduce the risk of cancer, cardio and cerebrovascular disease. Assess willingness: Unwilling to quit at this time, but is working on cutting back. Assist with counseling and pharmacotherapy: Counseled for 5 minutes and literature provided. Arrange for follow up: follow up in 3 months and continue to offer help.  

## 2020-08-01 NOTE — Assessment & Plan Note (Signed)
Started Wellbutrin considering smoking history May help with smoking cessation and mild weight gain

## 2020-08-01 NOTE — Assessment & Plan Note (Signed)
Annual exam as documented. Counseling done  re healthy lifestyle involving commitment to 150 minutes exercise per week, heart healthy diet, and attaining healthy weight.The importance of adequate sleep also discussed. Changes in health habits are decided on by the patient with goals and time frames  set for achieving them. Immunization and cancer screening needs are specifically addressed at this visit. 

## 2020-08-01 NOTE — Progress Notes (Signed)
Established Patient Office Visit  Subjective:  Patient ID: Mario Nguyen, male    DOB: Nov 01, 1990  Age: 30 y.o. MRN: [id number]  CC:  Chief Complaint  Patient presents with  . Annual Exam    CPE  . Blister    Has a sore on buttocks, ongoing x1 month, has been draining. Not painful.    HPI Mario Nguyen presents for annual physical.  He c/o rectal discomfort/soreness and has had intermittent rectal bleeding. Denies any melena. Denies any fever, chills, malaise, lumps or bumps. He does not use barrier contraceptive.  He has been having itching and whitish patches in the groin area.  He has been feeling depressed for last few months. Has decreased appetite, sleeps for longer periods and has difficulty concentrating. Continues to smoke 1 pack/day.  Past Medical History:  Diagnosis Date  . Encounter for hepatitis C screening test for low risk patient 06/28/2019    No past surgical history on file.  Family History  Problem Relation Age of Onset  . Hypertension Mother   . Learning disabilities Maternal Grandmother   . Stroke Maternal Grandfather     Social History   Socioeconomic History  . Marital status: Single    Spouse name: Not on file  . Number of children: Not on file  . Years of education: 44  . Highest education level: 10th grade  Occupational History  . Not on file  Tobacco Use  . Smoking status: Current Every Day Smoker    Packs/day: 0.50    Years: 6.00    Pack years: 3.00    Types: Cigarettes  . Smokeless tobacco: Never Used  Vaping Use  . Vaping Use: Never used  Substance and Sexual Activity  . Alcohol use: Yes    Comment: occasional  . Drug use: Yes    Frequency: 7.0 times per week    Types: Marijuana  . Sexual activity: Yes    Birth control/protection: None  Other Topics Concern  . Not on file  Social History Narrative   Lives fiance Alma Friendly 8 years    Dog: Mickel Fuchs y 3 years      Enjoys gardening, listening to music, just  relaxing.      Diet: Well balanced, but does like to eat sweets.   Caffeine: Does not drink much tea or sodas, does not drink coffee, most drinks Gatorade.    Water: 3-5 bottles of water daily      Wears seat belt   Does not wear sun protection   Smoke detectors   Occasional use of phone while driving   Social Determinants of Health   Financial Resource Strain: Not on file  Food Insecurity: Not on file  Transportation Needs: Not on file  Physical Activity: Not on file  Stress: Not on file  Social Connections: Not on file  Intimate Partner Violence: Not on file    No outpatient medications prior to visit.   No facility-administered medications prior to visit.    No Known Allergies  ROS Review of Systems  Constitutional: Positive for activity change, appetite change and unexpected weight change. Negative for chills and fever.  HENT: Negative for congestion and sore throat.   Eyes: Negative for pain and discharge.  Respiratory: Negative for cough and shortness of breath.   Cardiovascular: Negative for chest pain and palpitations.  Gastrointestinal: Negative for constipation, diarrhea, nausea and vomiting.  Endocrine: Negative for polydipsia and polyuria.  Genitourinary: Negative for dysuria and hematuria.  Musculoskeletal:  Negative for neck pain and neck stiffness.  Skin: Negative for rash.  Neurological: Negative for dizziness, weakness, numbness and headaches.  Psychiatric/Behavioral: Positive for decreased concentration, dysphoric mood and sleep disturbance. Negative for agitation, behavioral problems and suicidal ideas.      Objective:    Physical Exam Vitals reviewed.  Constitutional:      General: He is not in acute distress.    Appearance: He is not diaphoretic.  HENT:     Head: Normocephalic and atraumatic.     Nose: Nose normal.     Mouth/Throat:     Mouth: Mucous membranes are moist.  Eyes:     General: No scleral icterus.    Extraocular Movements:  Extraocular movements intact.     Pupils: Pupils are equal, round, and reactive to light.  Cardiovascular:     Rate and Rhythm: Normal rate and regular rhythm.     Pulses: Normal pulses.     Heart sounds: Normal heart sounds. No murmur heard.   Pulmonary:     Breath sounds: Normal breath sounds. No wheezing or rales.  Abdominal:     Palpations: Abdomen is soft.     Tenderness: There is no abdominal tenderness.  Genitourinary:    Comments: External hemorrhoids noted, mild swelling Musculoskeletal:     Cervical back: Neck supple. No tenderness.     Right lower leg: No edema.     Left lower leg: No edema.  Skin:    General: Skin is warm.     Findings: No rash.     Comments: Whitish patches in the groin area  Neurological:     General: No focal deficit present.     Mental Status: He is alert and oriented to person, place, and time.     Cranial Nerves: No cranial nerve deficit.     Sensory: No sensory deficit.     Motor: No weakness.  Psychiatric:        Mood and Affect: Mood normal.        Behavior: Behavior normal.     BP 134/69 (BP Location: Right Arm, Patient Position: Sitting, Cuff Size: Normal)   Pulse 78   Temp 98.5 F (36.9 C) (Temporal)   Ht '5\' 5"'  (1.651 m)   Wt 144 lb (65.3 kg)   SpO2 98%   BMI 23.96 kg/m  Wt Readings from Last 3 Encounters:  08/01/20 144 lb (65.3 kg)  06/28/19 154 lb (69.9 kg)  01/24/19 149 lb (67.6 kg)     Health Maintenance Due  Topic Date Due  . Hepatitis C Screening  Never done    There are no preventive care reminders to display for this patient.  Lab Results  Component Value Date   TSH 0.92 08/29/2015   Lab Results  Component Value Date   WBC 11.0 (H) 11/04/2016   HGB 16.1 11/04/2016   HCT 45.6 11/04/2016   MCV 81.0 11/04/2016   PLT 305 11/04/2016   Lab Results  Component Value Date   NA 137 11/04/2016   K 3.7 11/04/2016   CO2 27 11/04/2016   GLUCOSE 137 (H) 11/04/2016   BUN 20 11/04/2016   CREATININE 1.16  11/04/2016   BILITOT 1.8 (H) 11/04/2016   ALKPHOS 55 11/04/2016   AST 26 11/04/2016   ALT 30 11/04/2016   PROT 9.2 (H) 11/04/2016   ALBUMIN 5.2 (H) 11/04/2016   CALCIUM 10.0 11/04/2016   ANIONGAP 13 11/04/2016   Lab Results  Component Value Date   CHOL 160  08/29/2015   Lab Results  Component Value Date   HDL 44 08/29/2015   Lab Results  Component Value Date   LDLCALC 101 08/29/2015   Lab Results  Component Value Date   TRIG 76 08/29/2015   Lab Results  Component Value Date   CHOLHDL 3.6 08/29/2015   No results found for: HGBA1C    Assessment & Plan:   Problem List Items Addressed This Visit      Annual visit for general adult medical examination with abnormal findings - Primary   Annual exam as documented. Counseling done  re healthy lifestyle involving commitment to 150 minutes exercise per week, heart healthy diet, and attaining healthy weight.The importance of adequate sleep also discussed. Changes in health habits are decided on by the patient with goals and time frames  set for achieving them. Immunization and cancer screening needs are specifically addressed at this visit.     Relevant Orders  CBC with Differential  CMP14+EGFR  Lipid panel  TSH  Vitamin D (25 hydroxy)    Cardiovascular and Mediastinum   Hemorrhoids    Anusol cream prescribed Increase fluid intake Metamucil PRN      Relevant Medications   hydrocortisone (ANUSOL-HC) 2.5 % rectal cream     Musculoskeletal and Integument   Intertrigo    In the groin area Ketoconazole cream prescribed      Relevant Medications   ketoconazole (NIZORAL) 2 % cream     Other   Screening for STD (sexually transmitted disease)    Unprotected intercourse history Advised to use barrier contraceptives      Relevant Orders   STD Screen (6)   Tobacco use disorder    Smokes about 1 pack/day  Asked about quitting: confirms that he currently smokes cigarettes Advise to quit smoking: Educated about  QUITTING to reduce the risk of cancer, cardio and cerebrovascular disease. Assess willingness: Unwilling to quit at this time, but is working on cutting back. Assist with counseling and pharmacotherapy: Counseled for 5 minutes and literature provided. Arrange for follow up: follow up in 3 months and continue to offer help.         Depression, major, single episode, moderate (HCC)    Started Wellbutrin considering smoking history May help with smoking cessation and mild weight gain      Relevant Medications   buPROPion (WELLBUTRIN SR) 150 MG 12 hr tablet   Other Relevant Orders   CBC with Differential   CMP14+EGFR   TSH   RESOLVED: Encounter for hepatitis C screening test for low risk patient   Relevant Orders   Hepatitis C Antibody    Other Visit Diagnoses    Encounter for screening for human immunodeficiency virus (HIV)       Relevant Orders   HIV antibody (with reflex)      Meds ordered this encounter  Medications  . hydrocortisone (ANUSOL-HC) 2.5 % rectal cream    Sig: Place 1 application rectally 2 (two) times daily.    Dispense:  30 g    Refill:  0  . ketoconazole (NIZORAL) 2 % cream    Sig: Apply 1 application topically daily.    Dispense:  15 g    Refill:  0  . buPROPion (WELLBUTRIN SR) 150 MG 12 hr tablet    Sig: Take 1 tablet (150 mg total) by mouth 2 (two) times daily.    Dispense:  60 tablet    Refill:  2    Follow-up: Return in about 3 months (  around 10/31/2020) for Depression.    Lindell Spar, MD

## 2020-08-01 NOTE — Assessment & Plan Note (Signed)
Anusol cream prescribed Increase fluid intake Metamucil PRN

## 2020-08-01 NOTE — Patient Instructions (Signed)
Please start taking Wellbutrin as prescribed.  Please use Ketoconazole cream in the groin area.  Please apply Anusol for hemorrhoid. Please increase water intake to avoid constipation. Okay to take Colace or Metamucil to help with constipation.  You are advised to use barrier contraceptive to reduce risk of STDs.

## 2020-08-01 NOTE — Assessment & Plan Note (Signed)
In the groin area Ketoconazole cream prescribed

## 2020-08-02 ENCOUNTER — Other Ambulatory Visit: Payer: Self-pay | Admitting: Internal Medicine

## 2020-08-02 DIAGNOSIS — E559 Vitamin D deficiency, unspecified: Secondary | ICD-10-CM

## 2020-08-02 LAB — CMP14+EGFR
ALT: 14 IU/L (ref 0–44)
AST: 18 IU/L (ref 0–40)
Albumin/Globulin Ratio: 2 (ref 1.2–2.2)
Albumin: 4.9 g/dL (ref 4.1–5.2)
Alkaline Phosphatase: 62 IU/L (ref 44–121)
BUN/Creatinine Ratio: 16 (ref 9–20)
BUN: 16 mg/dL (ref 6–20)
Bilirubin Total: 0.6 mg/dL (ref 0.0–1.2)
CO2: 22 mmol/L (ref 20–29)
Calcium: 9.8 mg/dL (ref 8.7–10.2)
Chloride: 97 mmol/L (ref 96–106)
Creatinine, Ser: 1.02 mg/dL (ref 0.76–1.27)
Globulin, Total: 2.5 g/dL (ref 1.5–4.5)
Glucose: 88 mg/dL (ref 65–99)
Potassium: 3.9 mmol/L (ref 3.5–5.2)
Sodium: 139 mmol/L (ref 134–144)
Total Protein: 7.4 g/dL (ref 6.0–8.5)
eGFR: 102 mL/min/{1.73_m2} (ref >59)

## 2020-08-02 LAB — CBC WITH DIFFERENTIAL/PLATELET
Basophils Absolute: 0.1 10*3/uL (ref 0.0–0.2)
Basos: 1 %
EOS (ABSOLUTE): 0 10*3/uL (ref 0.0–0.4)
Eos: 1 %
Hematocrit: 39.6 % (ref 37.5–51.0)
Hemoglobin: 13.4 g/dL (ref 13.0–17.7)
Immature Grans (Abs): 0 10*3/uL (ref 0.0–0.1)
Immature Granulocytes: 0 %
Lymphocytes Absolute: 2.2 10*3/uL (ref 0.7–3.1)
Lymphs: 40 %
MCH: 28.9 pg (ref 26.6–33.0)
MCHC: 33.8 g/dL (ref 31.5–35.7)
MCV: 85 fL (ref 79–97)
Monocytes Absolute: 0.3 10*3/uL (ref 0.1–0.9)
Monocytes: 5 %
Neutrophils Absolute: 2.9 10*3/uL (ref 1.4–7.0)
Neutrophils: 53 %
Platelets: 336 10*3/uL (ref 150–450)
RBC: 4.64 x10E6/uL (ref 4.14–5.80)
RDW: 13.6 % (ref 11.6–15.4)
WBC: 5.4 10*3/uL (ref 3.4–10.8)

## 2020-08-02 LAB — HEPATITIS C ANTIBODY: Hep C Virus Ab: <0.1 s/co ratio (ref 0.0–0.9)

## 2020-08-02 LAB — VITAMIN D 25 HYDROXY (VIT D DEFICIENCY, FRACTURES): Vit D, 25-Hydroxy: 9.5 ng/mL — ABNORMAL LOW (ref 30.0–100.0)

## 2020-08-02 LAB — STD SCREEN (6)
HCV Ab: <0.1 s/co ratio (ref 0.0–0.9)
HIV Screen 4th Generation wRfx: NONREACTIVE
HSV 1 Glycoprotein G Ab, IgG: 30.3 index — ABNORMAL HIGH (ref 0.00–0.90)
HSV 2 IgG, Type Spec: <0.91 index (ref 0.00–0.90)
Hepatitis B Surface Ag: NEGATIVE
RPR Ser Ql: NONREACTIVE

## 2020-08-02 LAB — LIPID PANEL
Chol/HDL Ratio: 2.4 ratio (ref 0.0–5.0)
Cholesterol, Total: 139 mg/dL (ref 100–199)
HDL: 57 mg/dL (ref >39)
LDL Chol Calc (NIH): 71 mg/dL (ref 0–99)
Triglycerides: 46 mg/dL (ref 0–149)
VLDL Cholesterol Cal: 11 mg/dL (ref 5–40)

## 2020-08-02 LAB — TSH: TSH: 0.639 u[IU]/mL (ref 0.450–4.500)

## 2020-08-02 LAB — HCV COMMENT:

## 2020-08-02 MED ORDER — VITAMIN D (ERGOCALCIFEROL) 1.25 MG (50000 UNIT) PO CAPS: 50000.0000 [IU] | ORAL_CAPSULE | ORAL | 5 refills | Status: DC

## 2020-08-29 ENCOUNTER — Encounter (HOSPITAL_COMMUNITY): Payer: Self-pay

## 2020-08-29 ENCOUNTER — Other Ambulatory Visit: Payer: Self-pay

## 2020-08-29 ENCOUNTER — Emergency Department (HOSPITAL_COMMUNITY)
Admission: EM | Admit: 2020-08-29 | Discharge: 2020-08-29 | Disposition: A | Discharge status: Home / Self Care | Payer: BC Managed Care – PPO | Attending: Emergency Medicine | Admitting: Emergency Medicine

## 2020-08-29 ENCOUNTER — Encounter: Payer: Self-pay | Admitting: Emergency Medicine

## 2020-08-29 ENCOUNTER — Ambulatory Visit
Admission: EM | Admit: 2020-08-29 | Discharge: 2020-08-29 | Disposition: A | Discharge status: Home / Self Care | Payer: BC Managed Care – PPO | Attending: Family Medicine | Admitting: Family Medicine

## 2020-08-29 DIAGNOSIS — J029 Acute pharyngitis, unspecified: Secondary | ICD-10-CM | POA: Diagnosis not present

## 2020-08-29 DIAGNOSIS — Z5321 Procedure and treatment not carried out due to patient leaving prior to being seen by health care provider: Secondary | ICD-10-CM | POA: Diagnosis not present

## 2020-08-29 DIAGNOSIS — J020 Streptococcal pharyngitis: Secondary | ICD-10-CM

## 2020-08-29 LAB — POCT RAPID STREP A (OFFICE): Rapid Strep A Screen: POSITIVE — AB

## 2020-08-29 MED ORDER — AMOXICILLIN 500 MG PO TABS: 500.0000 mg | ORAL_TABLET | Freq: Two times a day (BID) | ORAL | 0 refills | Status: AC

## 2020-08-29 NOTE — ED Provider Notes (Signed)
[facility]    CSN: [id number] Arrival date & time: 08/29/20  1420      History   Chief Complaint No chief complaint on file.   HPI Mario Nguyen is a 30 y.o. male.   Reports sore throat and left ear pain that began about 2 days ago.  Has history of strep throat in the past.  Reports headache, chills.  Has taken Tylenol at home with little relief.  Denies sick contacts.  Denies history of COVID.  Has not completed COVID vaccines.  Has not completed flu vaccine.  Denies abdominal pain, nausea, vomiting, diarrhea, rash, other symptoms.  ROS per HPI  The history is provided by the patient.    Past Medical History:  Diagnosis Date  . Encounter for hepatitis C screening test for low risk patient 06/28/2019    Patient Active Problem List   Diagnosis Date Noted  . Hemorrhoids 08/01/2020  . Depression, major, single episode, moderate (HCC) 08/01/2020  . Intertrigo 08/01/2020  . Annual visit for general adult medical examination with abnormal findings 06/28/2019  . Screening for STD (sexually transmitted disease) 08/03/2013  . Tobacco use disorder 08/03/2013    History reviewed. No pertinent surgical history.     Home Medications    Prior to Admission medications   Medication Sig Start Date End Date Taking? Authorizing Provider  amoxicillin (AMOXIL) 500 MG tablet Take 1 tablet (500 mg total) by mouth 2 (two) times daily for 10 days. 08/29/20 09/08/20 Yes Moshe Cipro, NP  Vitamin D, Ergocalciferol, (DRISDOL) 1.25 MG (50000 UNIT) CAPS capsule Take 1 capsule (50,000 Units total) by mouth every 7 (seven) days. 08/02/20   Anabel Halon, MD  buPROPion (WELLBUTRIN SR) 150 MG 12 hr tablet Take 1 tablet (150 mg total) by mouth 2 (two) times daily. 08/01/20   Anabel Halon, MD  hydrocortisone (ANUSOL-HC) 2.5 % rectal cream Place 1 application rectally 2 (two) times daily. 08/01/20   Anabel Halon, MD  ketoconazole (NIZORAL) 2 % cream Apply 1 application  topically daily. 08/01/20   Anabel Halon, MD    Family History Family History  Problem Relation Age of Onset  . Hypertension Mother   . Learning disabilities Maternal Grandmother   . Stroke Maternal Grandfather     Social History Social History   Tobacco Use  . Smoking status: Current Every Day Smoker    Packs/day: 0.50    Years: 6.00    Pack years: 3.00    Types: Cigarettes  . Smokeless tobacco: Never Used  Vaping Use  . Vaping Use: Never used  Substance Use Topics  . Alcohol use: Yes    Comment: occasional  . Drug use: Yes    Frequency: 7.0 times per week    Types: Marijuana     Allergies   Patient has no known allergies.   Review of Systems Review of Systems   Physical Exam Triage Vital Signs ED Triage Vitals [08/29/20 1447]  Enc Vitals Group     BP 133/79     Pulse Rate 97     Resp 18     Temp 100.2 F (37.9 C)     Temp Source Oral     SpO2 96 %     Weight      Height      Head Circumference      Peak Flow      Pain Score 8     Pain Loc      Pain  Edu?      Excl. in GC?    No data found.  Updated Vital Signs BP 133/79 (BP Location: Right Arm)   Pulse 97   Temp 100.2 F (37.9 C) (Oral)   Resp 18   SpO2 96%       Physical Exam Vitals and nursing note reviewed.  Constitutional:      General: He is not in acute distress.    Appearance: Normal appearance. He is well-developed and normal weight. He is ill-appearing.  HENT:     Head: Normocephalic and atraumatic.     Right Ear: Tympanic membrane, ear canal and external ear normal.     Left Ear: Tympanic membrane, ear canal and external ear normal.     Nose: Nose normal.     Mouth/Throat:     Mouth: Mucous membranes are moist.     Pharynx: Posterior oropharyngeal erythema present.     Tonsils: Tonsillar exudate present. 2+ on the right. 2+ on the left.  Eyes:     Extraocular Movements: Extraocular movements intact.     Conjunctiva/sclera: Conjunctivae normal.     Pupils: Pupils are  equal, round, and reactive to light.  Cardiovascular:     Rate and Rhythm: Normal rate and regular rhythm.     Heart sounds: Normal heart sounds. No murmur heard.   Pulmonary:     Effort: Pulmonary effort is normal. No respiratory distress.     Breath sounds: Normal breath sounds. No stridor. No wheezing, rhonchi or rales.  Chest:     Chest wall: No tenderness.  Abdominal:     Palpations: Abdomen is soft.     Tenderness: There is no abdominal tenderness.  Musculoskeletal:        General: Normal range of motion.     Cervical back: Normal range of motion and neck supple.  Lymphadenopathy:     Cervical: Cervical adenopathy present.  Skin:    General: Skin is warm and dry.     Capillary Refill: Capillary refill takes less than 2 seconds.  Neurological:     General: No focal deficit present.     Mental Status: He is alert and oriented to person, place, and time.  Psychiatric:        Mood and Affect: Mood normal.        Behavior: Behavior normal.        Thought Content: Thought content normal.      UC Treatments / Results  Labs (all labs ordered are listed, but only abnormal results are displayed) Labs Reviewed  POCT RAPID STREP A (OFFICE) - Abnormal; Notable for the following components:      Result Value   Rapid Strep A Screen Positive (*)    All other components within normal limits    EKG   Radiology No results found.  Procedures Procedures (including critical care time)  Medications Ordered in UC Medications - No data to display  Initial Impression / Assessment and Plan / UC Course  I have reviewed the triage vital signs and the nursing notes.  Pertinent labs & imaging results that were available during my care of the patient were reviewed by me and considered in my medical decision making (see chart for details).    Strep throat  Prescribed amoxicillin 500 mg twice daily x10 days May take ibuprofen and Tylenol as needed for pain Drink plenty of fluids  and get rest You were still contagious until you have been on antibiotics for 24 hours, so  stay home and away from everyone until then Follow up with this office or with primary care if symptoms are persisting.  Follow up in the ER for high fever, trouble swallowing, trouble breathing, other concerning symptoms.   Final Clinical Impressions(s) / UC Diagnoses   Final diagnoses:  Strep throat     Discharge Instructions     I have prescribed amoxicillin for you to take twice a day for 10 days  May take ibuprofen and Tylenol for throat pain.  May drink hot or cold fluids, whichever feels better  Stay home and away from everyone, until you have been on the medicine at least 24 hours.  You are still contagious until then.  Follow up with this office or with primary care if symptoms are persisting.  Follow up in the ER for high fever, trouble swallowing, trouble breathing, other concerning symptoms.     ED Prescriptions    Medication Sig Dispense Auth. Provider   amoxicillin (AMOXIL) 500 MG tablet Take 1 tablet (500 mg total) by mouth 2 (two) times daily for 10 days. 20 tablet Moshe Cipro, NP     PDMP not reviewed this encounter.   Moshe Cipro, NP 08/29/20 1526

## 2020-08-29 NOTE — Discharge Instructions (Addendum)
I have prescribed amoxicillin for you to take twice a day for 10 days  May take ibuprofen and Tylenol for throat pain.  May drink hot or cold fluids, whichever feels better  Stay home and away from everyone, until you have been on the medicine at least 24 hours.  You are still contagious until then.  Follow up with this office or with primary care if symptoms are persisting.  Follow up in the ER for high fever, trouble swallowing, trouble breathing, other concerning symptoms.

## 2020-08-29 NOTE — ED Triage Notes (Signed)
Sore throat and left sided ear pain that started 2 days ago.

## 2020-08-29 NOTE — ED Triage Notes (Signed)
Pt to er, pt states that he is here for trouble swallowing, states that it has hurt to swallow for the past three days, states that now the pain radiates into his ear, pt talking in full sentences, pt managing secretions.

## 2020-10-03 IMAGING — CT CT HEAD W/O CM
3 series · 15 of 47 positions shown, 18 images · non-contrast
Comparison: No pertinent prior studies available for comparison.

CLINICAL DATA: Headache, posttraumatic, head trauma, minor,
GCS>=13, high clinical risk, initial exam. Additional history
provided: Patient head butted 1 week ago, can not feel left side of
nose and cheek bone, no obvious bruising.

EXAM:
CT HEAD WITHOUT CONTRAST
TECHNIQUE: Contiguous axial images were obtained from the base of the skull
through the vertex without intravenous contrast.

[Series 2: head w o · axial · 0.44mm/px · z∈[+279,+419]mm · 9 of 34 slices shown, 12 images]
[im 3/34  brain]
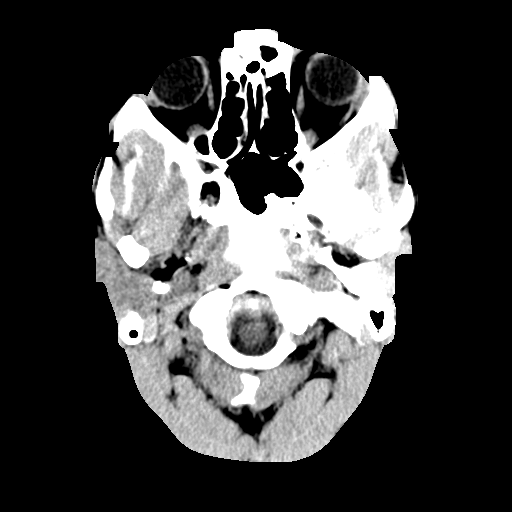
[im 3/34  bone]
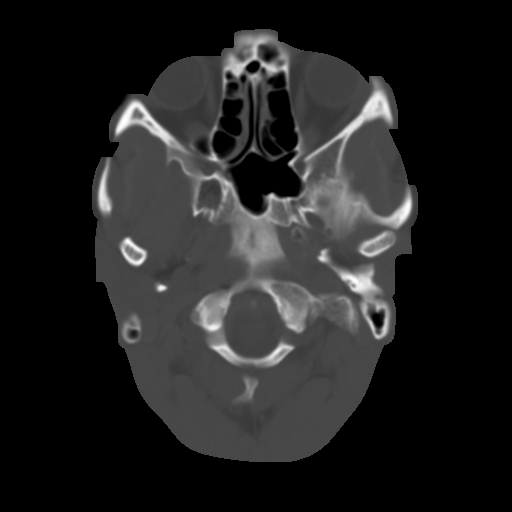
[im 6/34  brain]
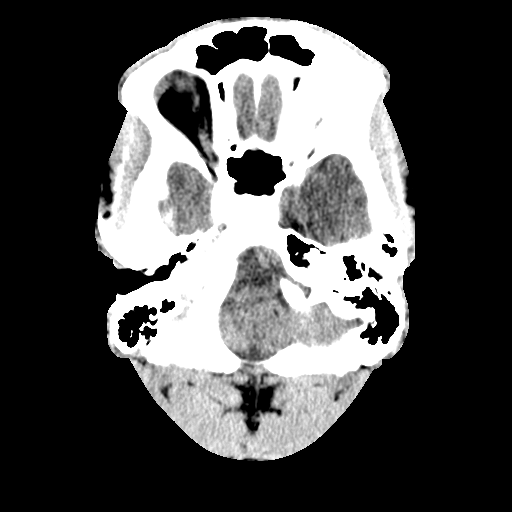
[im 10/34  brain]
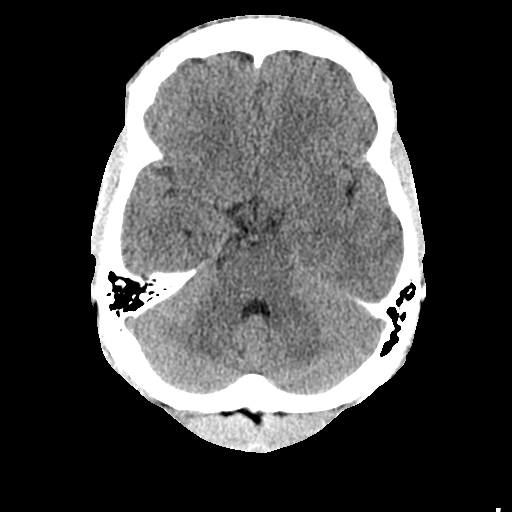
[im 13/34  brain]
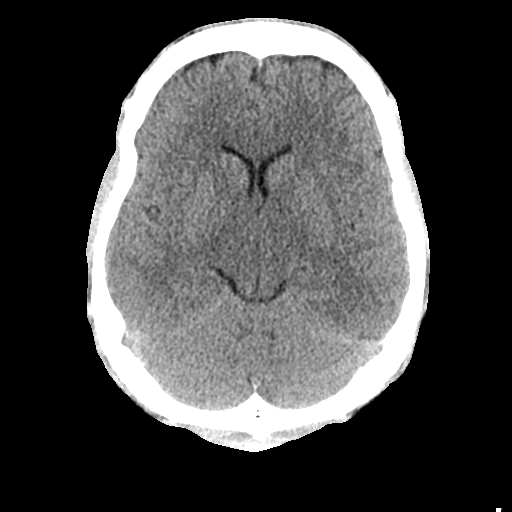
[im 18/34  brain]
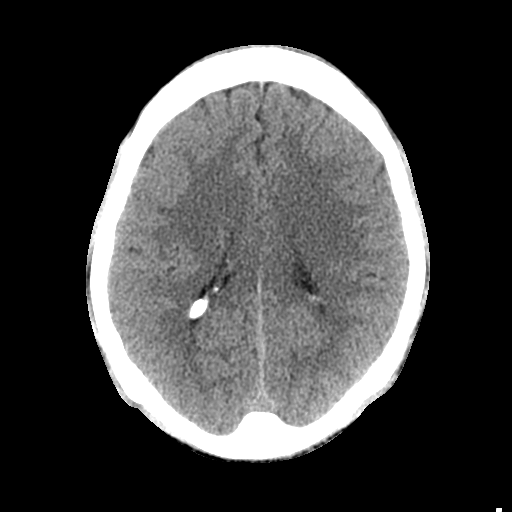
[im 18/34  bone]
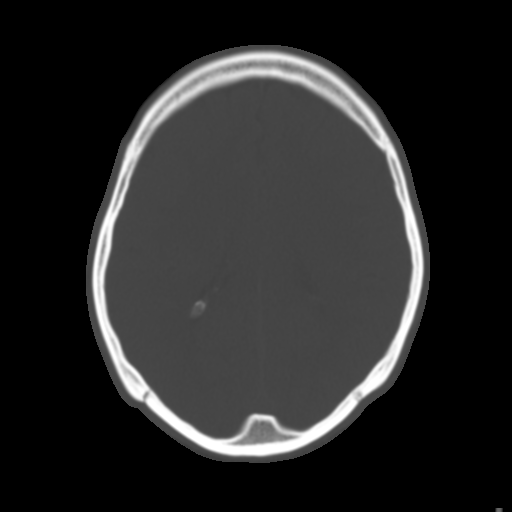
[im 21/34  brain]
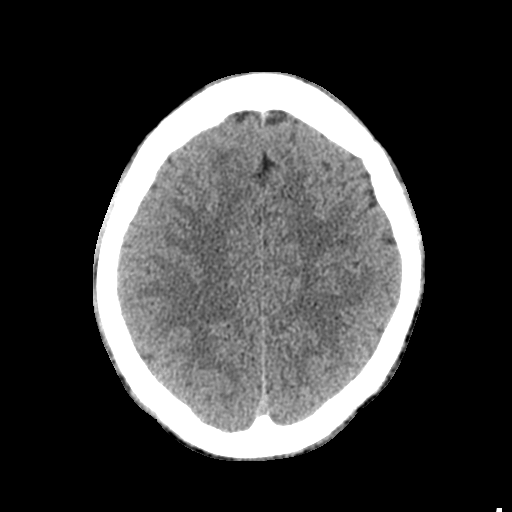
[im 24/34  brain]
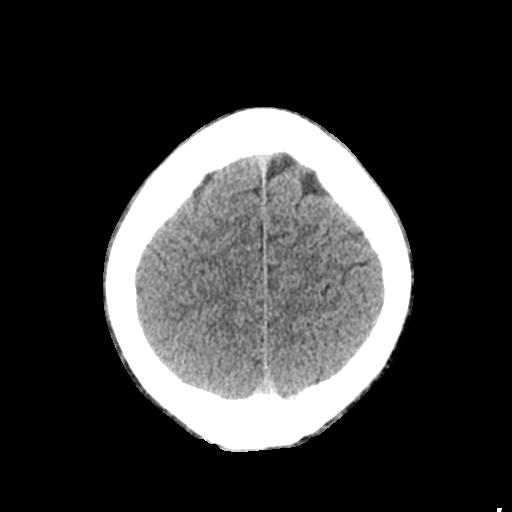
[im 28/34  brain]
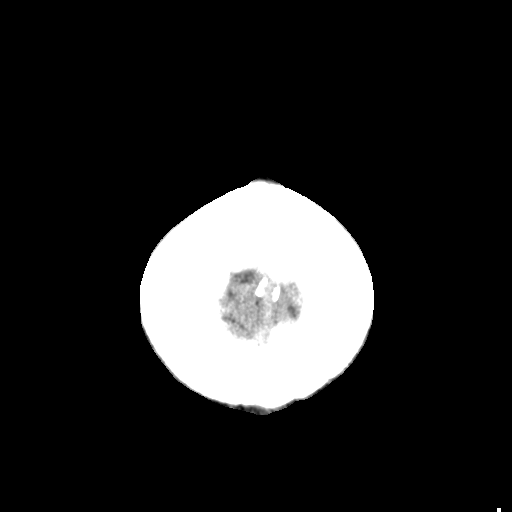
[im 31/34  brain]
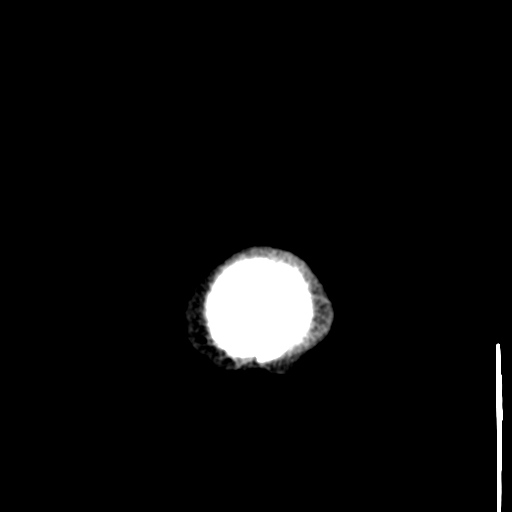
[im 31/34  bone]
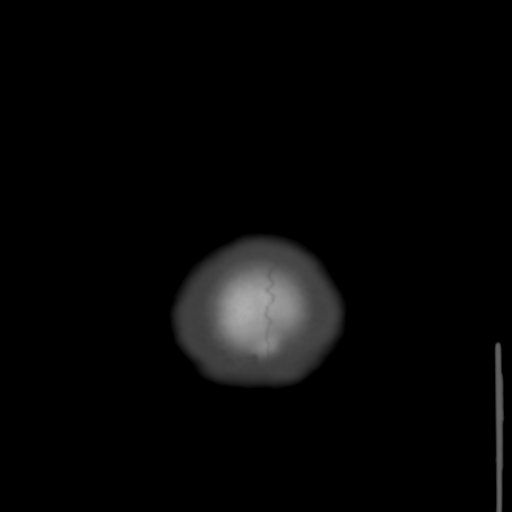

[Series 4: coronal soft · coronal · 0.33mm/px · 3 of 69 slices shown]
[im 23/69  brain]
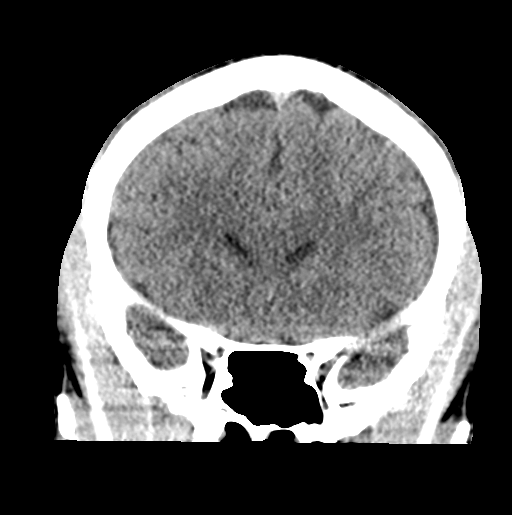
[im 31/69  brain]
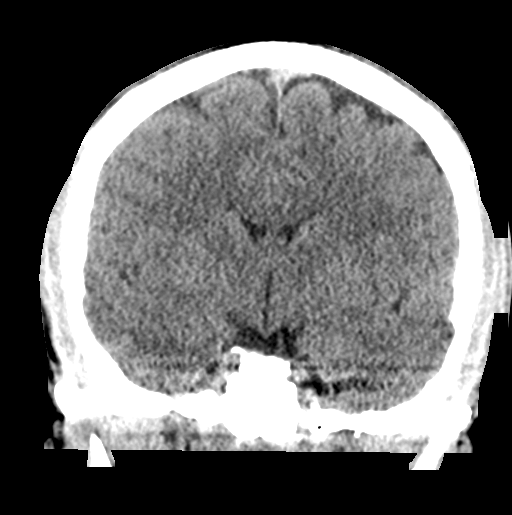
[im 38/69  brain]
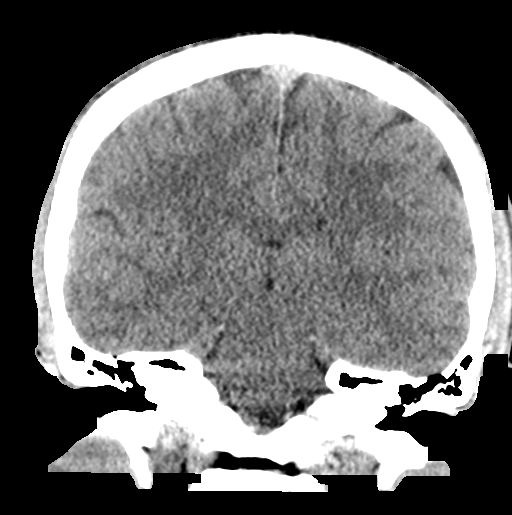

[Series 5: sagittal soft · sagittal · 0.33mm/px · 3 of 56 slices shown]
[im 19/56  brain]
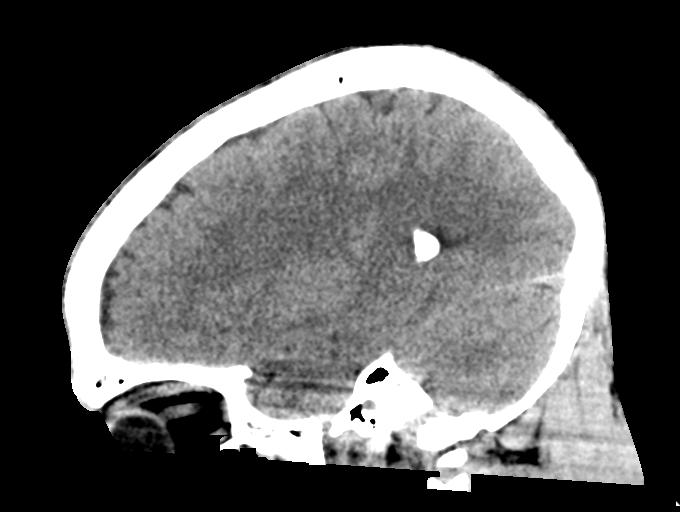
[im 28/56  brain]
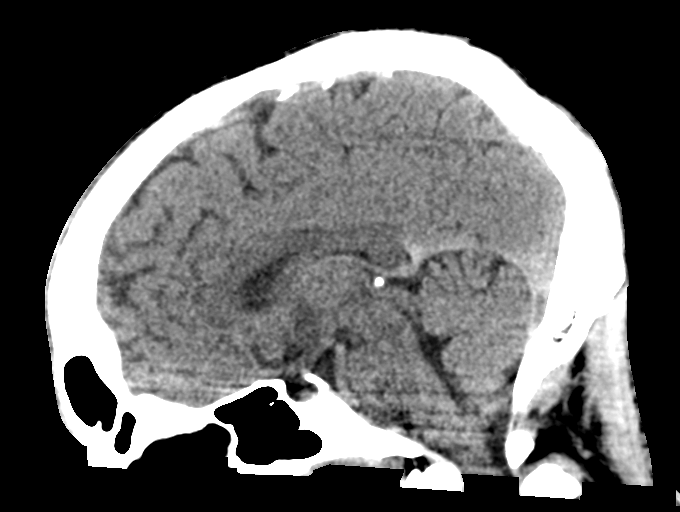
[im 37/56  brain]
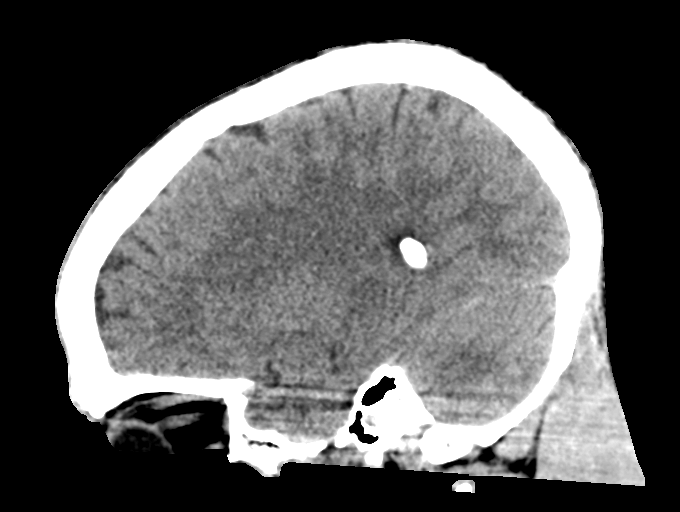

[15 of 47 positions shown; findings below may reference images not displayed]

FINDINGS: Brain:

No evidence of acute intracranial hemorrhage.

No demarcated cortical infarction.

No evidence of intracranial mass.

No midline shift or extra-axial fluid collection.

Cerebral volume is normal.

Vascular: No hyperdense vessel.

Skull: No calvarial fracture.

Sinuses/Orbits: Visualized orbits demonstrate no acute abnormality.
The paranasal sinuses and maxillofacial structures are incompletely
imaged. Mild ethmoid sinus mucosal thickening. No significant
mastoid effusion.
IMPRESSION: No evidence of acute intracranial abnormality.

## 2020-10-30 ENCOUNTER — Ambulatory Visit: Payer: BC Managed Care – PPO | Admitting: Internal Medicine

## 2020-11-23 ENCOUNTER — Ambulatory Visit (INDEPENDENT_AMBULATORY_CARE_PROVIDER_SITE_OTHER): Payer: BC Managed Care – PPO | Admitting: Internal Medicine

## 2020-11-23 ENCOUNTER — Encounter: Payer: Self-pay | Admitting: Internal Medicine

## 2020-11-23 ENCOUNTER — Other Ambulatory Visit: Payer: Self-pay

## 2020-11-23 VITALS — BP 129/80 | HR 67 | Temp 98.1°F | Resp 18 | Ht 65.0 in | Wt 148.0 lb

## 2020-11-23 DIAGNOSIS — F17200 Nicotine dependence, unspecified, uncomplicated: Secondary | ICD-10-CM

## 2020-11-23 DIAGNOSIS — F321 Major depressive disorder, single episode, moderate: Secondary | ICD-10-CM | POA: Diagnosis not present

## 2020-11-23 MED ORDER — BUPROPION HCL ER (SR) 150 MG PO TB12: 150.0000 mg | ORAL_TABLET | Freq: Two times a day (BID) | ORAL | 5 refills | Status: DC

## 2020-11-23 NOTE — Assessment & Plan Note (Signed)
Smokes about 1 pack/day, has been trying cut down - states he smoked about 1 cigarette/day in last 1 week  Asked about quitting: confirms that he currently smokes cigarettes Advise to quit smoking: Educated about QUITTING to reduce the risk of cancer, cardio and cerebrovascular disease. Assess willingness: Unwilling to quit at this time, but is working on cutting back. Assist with counseling and pharmacotherapy: Counseled for 5 minutes and literature provided. Arrange for follow up: follow up in 3 months and continue to offer help.

## 2020-11-23 NOTE — Progress Notes (Signed)
Established Patient Office Visit  Subjective:  Patient ID: Mario Nguyen, male    DOB: [birth year]  Age: 30 y.o. MRN: [id number]  CC:  Chief Complaint  Patient presents with   Follow-up    3 month follow up depression this has been better     HPI Mario Nguyen is a 30 year old male with PMH of depression and tobacco abuse who presents for follow up of depression.  Depression: He feels better with Wellbutrin. Denies any anhedonia, anxiety, SI or HI.  Tobacco abuse: He has been smoking only 1 cigarette/day in the last week. Used to smoke about 1 pack/day before that.  Past Medical History:  Diagnosis Date   Encounter for hepatitis C screening test for low risk patient 06/28/2019    History reviewed. No pertinent surgical history.  Family History  Problem Relation Age of Onset   Hypertension Mother    Learning disabilities Maternal Grandmother    Stroke Maternal Grandfather     Social History   Socioeconomic History   Marital status: Single    Spouse name: Not on file   Number of children: Not on file   Years of education: 10   Highest education level: 10th grade  Occupational History   Not on file  Tobacco Use   Smoking status: Every Day    Packs/day: 0.50    Years: 6.00    Pack years: 3.00    Types: Cigarettes   Smokeless tobacco: Never  Vaping Use   Vaping Use: Never used  Substance and Sexual Activity   Alcohol use: Yes    Comment: occasional   Drug use: Yes    Frequency: 7.0 times per week    Types: Marijuana   Sexual activity: Yes    Birth control/protection: None  Other Topics Concern   Not on file  Social History Narrative   Lives fiance Manning 8 years    Dog: Mickel Fuchs y 3 years      Enjoys gardening, listening to music, just relaxing.      Diet: Well balanced, but does like to eat sweets.   Caffeine: Does not drink much tea or sodas, does not drink coffee, most drinks Gatorade.    Water: 3-5 bottles of water daily      Wears  seat belt   Does not wear sun protection   Smoke detectors   Occasional use of phone while driving   Social Determinants of Health   Financial Resource Strain: Not on file  Food Insecurity: Not on file  Transportation Needs: Not on file  Physical Activity: Not on file  Stress: Not on file  Social Connections: Not on file  Intimate Partner Violence: Not on file    Outpatient Medications Prior to Visit  Medication Sig Dispense Refill   hydrocortisone (ANUSOL-HC) 2.5 % rectal cream Place 1 application rectally 2 (two) times daily. 30 g 0   ketoconazole (NIZORAL) 2 % cream Apply 1 application topically daily. 15 g 0   buPROPion (WELLBUTRIN SR) 150 MG 12 hr tablet Take 1 tablet (150 mg total) by mouth 2 (two) times daily. 60 tablet 2   Vitamin D, Ergocalciferol, (DRISDOL) 1.25 MG (50000 UNIT) CAPS capsule Take 1 capsule (50,000 Units total) by mouth every 7 (seven) days. (Patient not taking: Reported on 11/23/2020) 5 capsule 5   No facility-administered medications prior to visit.    No Known Allergies  ROS Review of Systems  Constitutional:  Negative for chills and fever.  HENT:  Negative for congestion and sore throat.   Eyes:  Negative for pain and discharge.  Respiratory:  Negative for cough and shortness of breath.   Cardiovascular:  Negative for chest pain and palpitations.  Gastrointestinal:  Negative for constipation, diarrhea, nausea and vomiting.  Endocrine: Negative for polydipsia and polyuria.  Genitourinary:  Negative for dysuria and hematuria.  Musculoskeletal:  Negative for neck pain and neck stiffness.  Skin:  Negative for rash.  Neurological:  Negative for dizziness, weakness, numbness and headaches.  Psychiatric/Behavioral:  Negative for agitation, behavioral problems, decreased concentration, dysphoric mood, sleep disturbance and suicidal ideas.      Objective:    Physical Exam Vitals reviewed.  Constitutional:      General: He is not in acute distress.     Appearance: He is not diaphoretic.  HENT:     Head: Normocephalic and atraumatic.     Nose: Nose normal.     Mouth/Throat:     Mouth: Mucous membranes are moist.  Eyes:     General: No scleral icterus.    Extraocular Movements: Extraocular movements intact.  Cardiovascular:     Rate and Rhythm: Normal rate and regular rhythm.     Pulses: Normal pulses.     Heart sounds: Normal heart sounds. No murmur heard. Pulmonary:     Breath sounds: Normal breath sounds. No wheezing or rales.  Abdominal:     Palpations: Abdomen is soft.     Tenderness: There is no abdominal tenderness.  Musculoskeletal:     Cervical back: Neck supple. No tenderness.     Right lower leg: No edema.     Left lower leg: No edema.  Skin:    General: Skin is warm.     Findings: No rash.  Neurological:     General: No focal deficit present.     Mental Status: He is alert and oriented to person, place, and time.     Sensory: No sensory deficit.     Motor: No weakness.  Psychiatric:        Mood and Affect: Mood normal.        Behavior: Behavior normal.    BP 129/80 (BP Location: Left Arm, Patient Position: Sitting, Cuff Size: Normal)   Pulse 67   Temp 98.1 F (36.7 C) (Oral)   Resp 18   Ht '5\' 5"'  (1.651 m)   Wt 148 lb (67.1 kg)   SpO2 96%   BMI 24.63 kg/m  Wt Readings from Last 3 Encounters:  11/23/20 148 lb (67.1 kg)  08/01/20 144 lb (65.3 kg)  06/28/19 154 lb (69.9 kg)     Health Maintenance Due  Topic Date Due   COVID-19 Vaccine (1) Never done   Pneumococcal Vaccine 11-53 Years old (1 - PCV) Never done   INFLUENZA VACCINE  11/19/2020    There are no preventive care reminders to display for this patient.  Lab Results  Component Value Date   TSH 0.639 08/01/2020   Lab Results  Component Value Date   WBC 5.4 08/01/2020   HGB 13.4 08/01/2020   HCT 39.6 08/01/2020   MCV 85 08/01/2020   PLT 336 08/01/2020   Lab Results  Component Value Date   NA 139 08/01/2020   K 3.9 08/01/2020    CO2 22 08/01/2020   GLUCOSE 88 08/01/2020   BUN 16 08/01/2020   CREATININE 1.02 08/01/2020   BILITOT 0.6 08/01/2020   ALKPHOS 62 08/01/2020   AST 18 08/01/2020   ALT 14 08/01/2020  PROT 7.4 08/01/2020   ALBUMIN 4.9 08/01/2020   CALCIUM 9.8 08/01/2020   ANIONGAP 13 11/04/2016   EGFR 102 08/01/2020   Lab Results  Component Value Date   CHOL 139 08/01/2020   Lab Results  Component Value Date   HDL 57 08/01/2020   Lab Results  Component Value Date   LDLCALC 71 08/01/2020   Lab Results  Component Value Date   TRIG 46 08/01/2020   Lab Results  Component Value Date   CHOLHDL 2.4 08/01/2020   No results found for: HGBA1C    Assessment & Plan:   Problem List Items Addressed This Visit       Other   Tobacco use disorder    Smokes about 1 pack/day, has been trying cut down - states he smoked about 1 cigarette/day in last 1 week  Asked about quitting: confirms that he currently smokes cigarettes Advise to quit smoking: Educated about QUITTING to reduce the risk of cancer, cardio and cerebrovascular disease. Assess willingness: Unwilling to quit at this time, but is working on cutting back. Assist with counseling and pharmacotherapy: Counseled for 5 minutes and literature provided. Arrange for follow up: follow up in 3 months and continue to offer help.       Depression, major, single episode, moderate ([place]) - Primary    Flowsheet Row Office Visit from 11/23/2020 in [facility]  PHQ-9 Total Score 0  Well-controlled with Wellbutrin      Relevant Medications   buPROPion (WELLBUTRIN SR) 150 MG 12 hr tablet    Meds ordered this encounter  Medications   buPROPion (WELLBUTRIN SR) 150 MG 12 hr tablet    Sig: Take 1 tablet (150 mg total) by mouth 2 (two) times daily.    Dispense:  60 tablet    Refill:  5    Follow-up: Return in about 6 months (around 05/26/2021) for Depression and tobacco use.    Lindell Spar, MD

## 2020-11-23 NOTE — Assessment & Plan Note (Signed)
Flowsheet Row Office Visit from 11/23/2020 in [facility]  PHQ-9 Total Score 0     Well-controlled with Wellbutrin

## 2020-11-23 NOTE — Patient Instructions (Signed)
Please continue taking medications as prescribed.  Please continue your efforts to cut down --> quit smoking. 

## 2021-04-23 ENCOUNTER — Other Ambulatory Visit: Payer: Self-pay

## 2021-04-23 ENCOUNTER — Encounter: Payer: Self-pay | Admitting: Emergency Medicine

## 2021-04-23 ENCOUNTER — Ambulatory Visit
Admission: EM | Admit: 2021-04-23 | Discharge: 2021-04-23 | Disposition: A | Discharge status: Home / Self Care | Payer: BC Managed Care – PPO | Attending: Urgent Care | Admitting: Urgent Care

## 2021-04-23 DIAGNOSIS — Z9189 Other specified personal risk factors, not elsewhere classified: Secondary | ICD-10-CM

## 2021-04-23 DIAGNOSIS — K921 Melena: Secondary | ICD-10-CM

## 2021-04-23 DIAGNOSIS — R070 Pain in throat: Secondary | ICD-10-CM | POA: Diagnosis not present

## 2021-04-23 DIAGNOSIS — F17200 Nicotine dependence, unspecified, uncomplicated: Secondary | ICD-10-CM

## 2021-04-23 DIAGNOSIS — B349 Viral infection, unspecified: Secondary | ICD-10-CM

## 2021-04-23 DIAGNOSIS — R052 Subacute cough: Secondary | ICD-10-CM

## 2021-04-23 MED ORDER — ONDANSETRON 8 MG PO TBDP: 8.0000 mg | ORAL_TABLET | Freq: Three times a day (TID) | ORAL | 0 refills | Status: DC | PRN

## 2021-04-23 MED ORDER — PSEUDOEPHEDRINE HCL 30 MG PO TABS: 30.0000 mg | ORAL_TABLET | Freq: Three times a day (TID) | ORAL | 0 refills | Status: DC | PRN

## 2021-04-23 MED ORDER — CETIRIZINE HCL 10 MG PO TABS: 10.0000 mg | ORAL_TABLET | Freq: Every day | ORAL | 0 refills | Status: DC

## 2021-04-23 MED ORDER — NAPROXEN 375 MG PO TABS: 375.0000 mg | ORAL_TABLET | Freq: Two times a day (BID) | ORAL | 0 refills | Status: DC

## 2021-04-23 NOTE — Discharge Instructions (Signed)

## 2021-04-23 NOTE — ED Provider Notes (Signed)
[facility]   MRN: [id number] DOB: [birth year]  Subjective:   Mario Nguyen is a 31 y.o. male presenting for 3 day history of throat pain, scratchy throat, sweats, vomiting, slight headache, loose stools. Patient smokes 1/3ppd. Also smokes marijuana weekly.  No chest pain, shob, wheezing, abdominal pain, history of GI disorders. No constipation, masses of the anus.  No sick contacts.  He also noticed some blood in his stool.  Has a history of hemorrhoids but has not had any in a while.  He also has receptive anal sex but has no concern for STI at the moment as he is not sexually active.  No current facility-administered medications for this encounter.  Current Outpatient Medications:    buPROPion (WELLBUTRIN SR) 150 MG 12 hr tablet, Take 1 tablet (150 mg total) by mouth 2 (two) times daily., Disp: 60 tablet, Rfl: 5   hydrocortisone (ANUSOL-HC) 2.5 % rectal cream, Place 1 application rectally 2 (two) times daily., Disp: 30 g, Rfl: 0   ketoconazole (NIZORAL) 2 % cream, Apply 1 application topically daily., Disp: 15 g, Rfl: 0   No Known Allergies  Past Medical History:  Diagnosis Date   Encounter for hepatitis C screening test for low risk patient 06/28/2019     History reviewed. No pertinent surgical history.  Family History  Problem Relation Age of Onset   Hypertension Mother    Learning disabilities Maternal Grandmother    Stroke Maternal Grandfather     Social History   Tobacco Use   Smoking status: Every Day    Packs/day: 0.50    Years: 6.00    Pack years: 3.00    Types: Cigarettes   Smokeless tobacco: Never  Vaping Use   Vaping Use: Never used  Substance Use Topics   Alcohol use: Yes    Comment: occasional   Drug use: Yes    Frequency: 7.0 times per week    Types: Marijuana    ROS   Objective:   Vitals: BP (!) 144/73 (BP Location: Right Arm)    Pulse 86    Temp 98.3 F (36.8 C) (Oral)    Resp 18    SpO2 96%   Physical  Exam Constitutional:      General: He is not in acute distress.    Appearance: Normal appearance. He is well-developed and normal weight. He is not ill-appearing, toxic-appearing or diaphoretic.  HENT:     Head: Normocephalic and atraumatic.     Right Ear: External ear normal.     Left Ear: External ear normal.     Nose: Nose normal.     Mouth/Throat:     Mouth: Mucous membranes are moist.     Pharynx: Oropharynx is clear. No oropharyngeal exudate or posterior oropharyngeal erythema.  Eyes:     General: No scleral icterus.       Right eye: No discharge.        Left eye: No discharge.     Extraocular Movements: Extraocular movements intact.     Pupils: Pupils are equal, round, and reactive to light.  Cardiovascular:     Rate and Rhythm: Normal rate and regular rhythm.     Heart sounds: Normal heart sounds. No murmur heard.   No friction rub. No gallop.  Pulmonary:     Effort: Pulmonary effort is normal. No respiratory distress.     Breath sounds: Normal breath sounds. No stridor. No wheezing, rhonchi or rales.  Abdominal:     General: Bowel sounds  are normal. There is no distension.     Palpations: Abdomen is soft. There is no mass.     Tenderness: There is abdominal tenderness (generalized throughout). There is no right CVA tenderness, left CVA tenderness, guarding or rebound.  Genitourinary:    Rectum: No tenderness, anal fissure or external hemorrhoid.     Comments: Hemorrhoidal skin tags present. Musculoskeletal:     Cervical back: Normal range of motion.  Neurological:     Mental Status: He is alert and oriented to person, place, and time.     Cranial Nerves: No cranial nerve deficit.     Motor: No weakness.     Coordination: Coordination normal.     Gait: Gait normal.     Deep Tendon Reflexes: Reflexes normal.     Comments: Negative Kernig and Brudzinski.  Psychiatric:        Mood and Affect: Mood normal.        Behavior: Behavior normal.        Thought Content:  Thought content normal.        Judgment: Judgment normal.    Assessment and Plan :   PDMP not reviewed this encounter.  1. Acute viral syndrome   2. At increased risk of exposure to COVID-19 virus   3. Smoker   4. Throat pain   5. Subacute cough   6. Blood in stool    COVID and flu test pending.  We will otherwise manage for viral upper respiratory infection.  Physical exam findings reassuring and vital signs stable for discharge. Advised supportive care, offered symptomatic relief.  Perianal exam unremarkable.  Recommended follow-up with gastroenterologist for further evaluation of bloody stools.  Patient declined STI testing.  Counseled patient on potential for adverse effects with medications prescribed/recommended today, ER and return-to-clinic precautions discussed, patient verbalized understanding.       Wallis Bamberg, PA-C 04/23/21 1121

## 2021-04-23 NOTE — ED Triage Notes (Signed)
Sore throat x 3 days. Vomiting, headache today.  States he noticed some dark red blood in his stool 3 days ago.  States diarrhea today and throat is itchy today.

## 2021-04-24 LAB — COVID-19, FLU A+B NAA
Influenza A, NAA: NOT DETECTED
Influenza B, NAA: NOT DETECTED
SARS-CoV-2, NAA: DETECTED — AB

## 2021-05-28 ENCOUNTER — Encounter: Payer: Self-pay | Admitting: Internal Medicine

## 2021-05-28 ENCOUNTER — Other Ambulatory Visit: Payer: Self-pay

## 2021-05-28 ENCOUNTER — Ambulatory Visit (INDEPENDENT_AMBULATORY_CARE_PROVIDER_SITE_OTHER): Payer: BC Managed Care – PPO | Admitting: Internal Medicine

## 2021-05-28 VITALS — BP 118/82 | HR 78 | Resp 18 | Ht 65.0 in | Wt 150.0 lb

## 2021-05-28 DIAGNOSIS — Z0001 Encounter for general adult medical examination with abnormal findings: Secondary | ICD-10-CM

## 2021-05-28 DIAGNOSIS — K219 Gastro-esophageal reflux disease without esophagitis: Secondary | ICD-10-CM | POA: Insufficient documentation

## 2021-05-28 DIAGNOSIS — F321 Major depressive disorder, single episode, moderate: Secondary | ICD-10-CM

## 2021-05-28 DIAGNOSIS — Z113 Encounter for screening for infections with a predominantly sexual mode of transmission: Secondary | ICD-10-CM | POA: Diagnosis not present

## 2021-05-28 DIAGNOSIS — E559 Vitamin D deficiency, unspecified: Secondary | ICD-10-CM | POA: Diagnosis not present

## 2021-05-28 DIAGNOSIS — Z2821 Immunization not carried out because of patient refusal: Secondary | ICD-10-CM

## 2021-05-28 DIAGNOSIS — F17200 Nicotine dependence, unspecified, uncomplicated: Secondary | ICD-10-CM | POA: Diagnosis not present

## 2021-05-28 MED ORDER — OMEPRAZOLE 20 MG PO CPDR: 20.0000 mg | DELAYED_RELEASE_CAPSULE | Freq: Every day | ORAL | 3 refills | Status: DC

## 2021-05-28 NOTE — Assessment & Plan Note (Signed)
Flowsheet Row Office Visit from 05/28/2021 in [facility]  PHQ-9 Total Score 0     Resolved now DC Wellbutrin now

## 2021-05-28 NOTE — Progress Notes (Signed)
Established Patient Office Visit  Subjective:  Patient ID: Mario Nguyen, male    DOB: May 15, 1990  Age: 31 y.o. MRN: [id number]  CC:  Chief Complaint  Patient presents with   Follow-up    6 month follow up Depression and tobacco depression is better pt stopped taking bupropion as it made him sleepy    HPI Mario Nguyen is a 31 y.o. male with past medical history of depression and tobacco abuse who presents for f/u of his chronic medical conditions.  Depression: He denies any anhedonia, anxiety, SI or HI currently.  He has stopped taking Wellbutrin as it was making him sleepy.  Of note, he works at night shifts and reports that he sleeps about 7 hours daily.  He has cut down on smoking, and smokes about 1 to 2 cigarettes/day.  He recently went to urgent care for URTI and complaint of blood in stool, which was isolated episode.  He also complains of epigastric and periumbilical pain, which is intermittent.  He denies any nausea or vomiting currently.  Denies any melena recently.  Denies any rectal pain currently.  Past Medical History:  Diagnosis Date   Encounter for hepatitis C screening test for low risk patient 06/28/2019    History reviewed. No pertinent surgical history.  Family History  Problem Relation Age of Onset   Hypertension Mother    Learning disabilities Maternal Grandmother    Stroke Maternal Grandfather     Social History   Socioeconomic History   Marital status: Single    Spouse name: Not on file   Number of children: Not on file   Years of education: 10   Highest education level: 10th grade  Occupational History   Not on file  Tobacco Use   Smoking status: Every Day    Packs/day: 0.50    Years: 6.00    Pack years: 3.00    Types: Cigarettes   Smokeless tobacco: Never  Vaping Use   Vaping Use: Never used  Substance and Sexual Activity   Alcohol use: Yes    Comment: occasional   Drug use: Yes    Frequency: 7.0 times per week     Types: Marijuana   Sexual activity: Yes    Birth control/protection: None  Other Topics Concern   Not on file  Social History Narrative   Lives fiance Candelaria Arenas 8 years    Dog: Mickel Fuchs y 3 years      Enjoys gardening, listening to music, just relaxing.      Diet: Well balanced, but does like to eat sweets.   Caffeine: Does not drink much tea or sodas, does not drink coffee, most drinks Gatorade.    Water: 3-5 bottles of water daily      Wears seat belt   Does not wear sun protection   Smoke detectors   Occasional use of phone while driving   Social Determinants of Health   Financial Resource Strain: Not on file  Food Insecurity: Not on file  Transportation Needs: Not on file  Physical Activity: Not on file  Stress: Not on file  Social Connections: Not on file  Intimate Partner Violence: Not on file    Outpatient Medications Prior to Visit  Medication Sig Dispense Refill   hydrocortisone (ANUSOL-HC) 2.5 % rectal cream Place 1 application rectally 2 (two) times daily. 30 g 0   ondansetron (ZOFRAN-ODT) 8 MG disintegrating tablet Take 1 tablet (8 mg total) by mouth every 8 (eight) hours as  needed for nausea or vomiting. 20 tablet 0   buPROPion (WELLBUTRIN SR) 150 MG 12 hr tablet Take 1 tablet (150 mg total) by mouth 2 (two) times daily. (Patient not taking: Reported on 05/28/2021) 60 tablet 5   cetirizine (ZYRTEC ALLERGY) 10 MG tablet Take 1 tablet (10 mg total) by mouth daily. (Patient not taking: Reported on 05/28/2021) 30 tablet 0   ketoconazole (NIZORAL) 2 % cream Apply 1 application topically daily. (Patient not taking: Reported on 05/28/2021) 15 g 0   naproxen (NAPROSYN) 375 MG tablet Take 1 tablet (375 mg total) by mouth 2 (two) times daily with a meal. (Patient not taking: Reported on 05/28/2021) 30 tablet 0   pseudoephedrine (SUDAFED) 30 MG tablet Take 1 tablet (30 mg total) by mouth every 8 (eight) hours as needed for congestion. (Patient not taking: Reported on 05/28/2021) 30  tablet 0   No facility-administered medications prior to visit.    No Known Allergies  ROS Review of Systems  Constitutional:  Negative for chills and fever.  HENT:  Negative for congestion and sore throat.   Eyes:  Negative for pain and discharge.  Respiratory:  Negative for cough and shortness of breath.   Cardiovascular:  Negative for chest pain and palpitations.  Gastrointestinal:  Positive for abdominal pain. Negative for diarrhea, nausea and vomiting.  Endocrine: Negative for polydipsia and polyuria.  Genitourinary:  Negative for dysuria and hematuria.  Musculoskeletal:  Negative for neck pain and neck stiffness.  Skin:  Negative for rash.  Neurological:  Negative for dizziness, weakness, numbness and headaches.  Psychiatric/Behavioral:  Negative for agitation and behavioral problems.      Objective:    Physical Exam Vitals reviewed.  Constitutional:      General: He is not in acute distress.    Appearance: He is not diaphoretic.  HENT:     Head: Normocephalic and atraumatic.     Nose: Nose normal.     Mouth/Throat:     Mouth: Mucous membranes are moist.  Eyes:     General: No scleral icterus.    Extraocular Movements: Extraocular movements intact.  Cardiovascular:     Rate and Rhythm: Normal rate and regular rhythm.     Pulses: Normal pulses.     Heart sounds: Normal heart sounds. No murmur heard. Pulmonary:     Breath sounds: Normal breath sounds. No wheezing or rales.  Abdominal:     Palpations: Abdomen is soft.     Tenderness: There is no abdominal tenderness.  Musculoskeletal:     Cervical back: Neck supple. No tenderness.     Right lower leg: No edema.     Left lower leg: No edema.  Skin:    General: Skin is warm.     Findings: No rash.  Neurological:     General: No focal deficit present.     Mental Status: He is alert and oriented to person, place, and time.     Sensory: No sensory deficit.     Motor: No weakness.  Psychiatric:        Mood and  Affect: Mood normal.        Behavior: Behavior normal.    BP 118/82 (BP Location: Right Arm, Patient Position: Sitting, Cuff Size: Normal)    Pulse 78    Resp 18    Ht _0  (1.651 m)    Wt 150 lb 0.6 oz (68.1 kg)    SpO2 98%    BMI 24.97 kg/m  Wt Readings from  Last 3 Encounters:  05/28/21 150 lb 0.6 oz (68.1 kg)  11/23/20 148 lb (67.1 kg)  08/01/20 144 lb (65.3 kg)    Lab Results  Component Value Date   TSH 0.639 08/01/2020   Lab Results  Component Value Date   WBC 5.4 08/01/2020   HGB 13.4 08/01/2020   HCT 39.6 08/01/2020   MCV 85 08/01/2020   PLT 336 08/01/2020   Lab Results  Component Value Date   NA 139 08/01/2020   K 3.9 08/01/2020   CO2 22 08/01/2020   GLUCOSE 88 08/01/2020   BUN 16 08/01/2020   CREATININE 1.02 08/01/2020   BILITOT 0.6 08/01/2020   ALKPHOS 62 08/01/2020   AST 18 08/01/2020   ALT 14 08/01/2020   PROT 7.4 08/01/2020   ALBUMIN 4.9 08/01/2020   CALCIUM 9.8 08/01/2020   ANIONGAP 13 11/04/2016   EGFR 102 08/01/2020   Lab Results  Component Value Date   CHOL 139 08/01/2020   Lab Results  Component Value Date   HDL 57 08/01/2020   Lab Results  Component Value Date   LDLCALC 71 08/01/2020   Lab Results  Component Value Date   TRIG 46 08/01/2020   Lab Results  Component Value Date   CHOLHDL 2.4 08/01/2020   No results found for: HGBA1C    Assessment & Plan:   Problem List Items Addressed This Visit       Digestive   Gastroesophageal reflux disease without esophagitis    Epigastric pain could be due to gastritis/GERD Started omeprazole Avoid hot and spicy food Advised to contact if he has any episode of rectal bleeding, isolated episode could be due to internal hemorrhoid -advised to improve hydration and take stool softener as needed for constipation      Relevant Medications   omeprazole (PRILOSEC) 20 MG capsule     Other   Screening for STD (sexually transmitted disease)    Unprotected intercourse history Advised to  use barrier contraceptives      Relevant Orders   STD Screen (6)   Hepatitis B Surface AntiBODY   HIV antibody (with reflex)   RPR w/reflex to TrepSure   HSV(herpes simplex vrs) 1+2 ab-IgG   Tobacco use disorder    Has been trying cut down - states he smokes about 1-2 cigarette/day  Asked about quitting: confirms that he currently smokes cigarettes Advise to quit smoking: Educated about QUITTING to reduce the risk of cancer, cardio and cerebrovascular disease. Assess willingness: Unwilling to quit at this time, but is working on cutting back. Assist with counseling and pharmacotherapy: Counseled for 5 minutes and literature provided. Arrange for follow up: follow up in 3 months and continue to offer help.      Depression, major, single episode, moderate ([place]) - Primary    Flowsheet Row Office Visit from 05/28/2021 in [facility]  PHQ-9 Total Score 0  Resolved now DC Wellbutrin now      Other Visit Diagnoses     Refused influenza vaccine       Vitamin D deficiency       Relevant Orders   VITAMIN D 25 Hydroxy (Vit-D Deficiency, Fractures)       Meds ordered this encounter  Medications   omeprazole (PRILOSEC) 20 MG capsule    Sig: Take 1 capsule (20 mg total) by mouth daily.    Dispense:  30 capsule    Refill:  3    Follow-up: Return in about 3 months (around 08/25/2021) for Annual  physical.    Lindell Spar, MD

## 2021-05-28 NOTE — Assessment & Plan Note (Signed)
Unprotected intercourse history Advised to use barrier contraceptives 

## 2021-05-28 NOTE — Assessment & Plan Note (Signed)
Has been trying cut down - states he smokes about 1-2 cigarette/day  Asked about quitting: confirms that he currently smokes cigarettes Advise to quit smoking: Educated about QUITTING to reduce the risk of cancer, cardio and cerebrovascular disease. Assess willingness: Unwilling to quit at this time, but is working on cutting back. Assist with counseling and pharmacotherapy: Counseled for 5 minutes and literature provided. Arrange for follow up: follow up in 3 months and continue to offer help.

## 2021-05-28 NOTE — Patient Instructions (Addendum)
Please start taking Omeprazole for gastritis.  Avoid hot and spicy food.

## 2021-05-28 NOTE — Assessment & Plan Note (Addendum)
Epigastric pain could be due to gastritis/GERD Started omeprazole Avoid hot and spicy food Advised to contact if he has any episode of rectal bleeding, isolated episode could be due to internal hemorrhoid -advised to improve hydration and take stool softener as needed for constipation

## 2021-05-29 LAB — CMP14+EGFR
ALT: 13 IU/L (ref 0–44)
AST: 17 IU/L (ref 0–40)
Albumin/Globulin Ratio: 1.6 (ref 1.2–2.2)
Albumin: 4.4 g/dL (ref 4.1–5.2)
Alkaline Phosphatase: 60 IU/L (ref 44–121)
BUN/Creatinine Ratio: 11 (ref 9–20)
BUN: 13 mg/dL (ref 6–20)
Bilirubin Total: 0.4 mg/dL (ref 0.0–1.2)
CO2: 25 mmol/L (ref 20–29)
Calcium: 10 mg/dL (ref 8.7–10.2)
Chloride: 103 mmol/L (ref 96–106)
Creatinine, Ser: 1.17 mg/dL (ref 0.76–1.27)
Globulin, Total: 2.7 g/dL (ref 1.5–4.5)
Glucose: 89 mg/dL (ref 70–99)
Potassium: 4.8 mmol/L (ref 3.5–5.2)
Sodium: 141 mmol/L (ref 134–144)
Total Protein: 7.1 g/dL (ref 6.0–8.5)
eGFR: 87 mL/min/{1.73_m2} (ref >59)

## 2021-05-29 LAB — CBC WITH DIFFERENTIAL/PLATELET
Basophils Absolute: 0.1 10*3/uL (ref 0.0–0.2)
Basos: 2 %
EOS (ABSOLUTE): 0.1 10*3/uL (ref 0.0–0.4)
Eos: 3 %
Hematocrit: 40.5 % (ref 37.5–51.0)
Hemoglobin: 13.3 g/dL (ref 13.0–17.7)
Immature Grans (Abs): 0 10*3/uL (ref 0.0–0.1)
Immature Granulocytes: 0 %
Lymphocytes Absolute: 2.1 10*3/uL (ref 0.7–3.1)
Lymphs: 47 %
MCH: 27.2 pg (ref 26.6–33.0)
MCHC: 32.8 g/dL (ref 31.5–35.7)
MCV: 83 fL (ref 79–97)
Monocytes Absolute: 0.3 10*3/uL (ref 0.1–0.9)
Monocytes: 6 %
Neutrophils Absolute: 1.9 10*3/uL (ref 1.4–7.0)
Neutrophils: 42 %
Platelets: 325 10*3/uL (ref 150–450)
RBC: 4.89 x10E6/uL (ref 4.14–5.80)
RDW: 13.6 % (ref 11.6–15.4)
WBC: 4.5 10*3/uL (ref 3.4–10.8)

## 2021-05-29 LAB — LIPID PANEL
Chol/HDL Ratio: 3.3 ratio (ref 0.0–5.0)
Cholesterol, Total: 165 mg/dL (ref 100–199)
HDL: 50 mg/dL (ref >39)
LDL Chol Calc (NIH): 101 mg/dL — ABNORMAL HIGH (ref 0–99)
Triglycerides: 71 mg/dL (ref 0–149)
VLDL Cholesterol Cal: 14 mg/dL (ref 5–40)

## 2021-05-29 LAB — TSH: TSH: 0.261 u[IU]/mL — ABNORMAL LOW (ref 0.450–4.500)

## 2021-05-29 LAB — HEMOGLOBIN A1C
Est. average glucose Bld gHb Est-mCnc: 120 mg/dL
Hgb A1c MFr Bld: 5.8 % — ABNORMAL HIGH (ref 4.8–5.6)

## 2021-05-29 LAB — VITAMIN D 25 HYDROXY (VIT D DEFICIENCY, FRACTURES): Vit D, 25-Hydroxy: 16.3 ng/mL — ABNORMAL LOW (ref 30.0–100.0)

## 2021-05-30 LAB — RPR W/REFLEX TO TREPSURE

## 2021-05-31 LAB — HSV(HERPES SIMPLEX VRS) I + II AB-IGG
HSV 1 Glycoprotein G Ab, IgG: 27.3 index — ABNORMAL HIGH (ref 0.00–0.90)
HSV 2 IgG, Type Spec: <0.91 index (ref 0.00–0.90)

## 2021-05-31 LAB — HIV ANTIBODY (ROUTINE TESTING W REFLEX): HIV Screen 4th Generation wRfx: NONREACTIVE

## 2021-05-31 LAB — RPR W/REFLEX TO TREPSURE: RPR: NONREACTIVE

## 2021-05-31 LAB — T PALLIDUM ANTIBODY, EIA: T pallidum Antibody, EIA: NEGATIVE

## 2021-05-31 LAB — HEPATITIS B SURFACE ANTIBODY,QUALITATIVE: Hep B Surface Ab, Qual: REACTIVE

## 2021-07-03 ENCOUNTER — Other Ambulatory Visit: Payer: Self-pay

## 2021-07-03 ENCOUNTER — Ambulatory Visit
Admission: EM | Admit: 2021-07-03 | Discharge: 2021-07-03 | Disposition: A | Discharge status: Home / Self Care | Payer: BC Managed Care – PPO

## 2021-07-03 ENCOUNTER — Encounter: Payer: Self-pay | Admitting: Emergency Medicine

## 2021-07-03 DIAGNOSIS — R1032 Left lower quadrant pain: Secondary | ICD-10-CM | POA: Diagnosis not present

## 2021-07-03 NOTE — ED Triage Notes (Signed)
Lump in pelvic area x 1 week.  States area hurts when touched or when bending over ?

## 2021-07-03 NOTE — ED Provider Notes (Signed)
?[facility] ? ? ? ?CSN: [id number] ?Arrival date & time: 07/03/21  1425 ? ? ?  ? ?History   ?Chief Complaint ?No chief complaint on file. ? ? ?HPI ?Mario Nguyen is a 31 y.o. male.  ? ?Presenting today with a painful mass to the left groin area for about a week now.  States it hurts when he presses on the area or bends over.  He denies injury to the area, redness, fever, chills, abdominal pain, dysuria, hematuria, penile discharge, rashes or lesions, concern for STIs.  So far not trying anything over-the-counter for symptoms.  Not able to see his primary care provider for another month for this issue. ? ? ?Past Medical History:  ?Diagnosis Date  ? Encounter for hepatitis C screening test for low risk patient 06/28/2019  ? ? ?Patient Active Problem List  ? Diagnosis Date Noted  ? Gastroesophageal reflux disease without esophagitis 05/28/2021  ? Hemorrhoids 08/01/2020  ? Depression, major, single episode, moderate (HCC) 08/01/2020  ? Intertrigo 08/01/2020  ? Annual visit for general adult medical examination with abnormal findings 06/28/2019  ? Screening for STD (sexually transmitted disease) 08/03/2013  ? Tobacco use disorder 08/03/2013  ? ? ?History reviewed. No pertinent surgical history. ? ? ? ? ?Home Medications   ? ?Prior to Admission medications   ?Medication Sig Start Date End Date Taking? Authorizing Provider  ?hydrocortisone (ANUSOL-HC) 2.5 % rectal cream Place 1 application rectally 2 (two) times daily. 08/01/20   Anabel Halon, MD  ?omeprazole (PRILOSEC) 20 MG capsule Take 1 capsule (20 mg total) by mouth daily. 05/28/21   Anabel Halon, MD  ?ondansetron (ZOFRAN-ODT) 8 MG disintegrating tablet Take 1 tablet (8 mg total) by mouth every 8 (eight) hours as needed for nausea or vomiting. 04/23/21   Wallis Bamberg, PA-C  ? ? ?Family History ?Family History  ?Problem Relation Age of Onset  ? Hypertension Mother   ? Learning disabilities Maternal Grandmother   ? Stroke Maternal Grandfather    ? ? ?Social History ?Social History  ? ?Tobacco Use  ? Smoking status: Every Day  ?  Packs/day: 0.50  ?  Years: 6.00  ?  Pack years: 3.00  ?  Types: Cigarettes  ? Smokeless tobacco: Never  ?Vaping Use  ? Vaping Use: Never used  ?Substance Use Topics  ? Alcohol use: Yes  ?  Comment: occasional  ? Drug use: Yes  ?  Frequency: 7.0 times per week  ?  Types: Marijuana  ? ? ? ?Allergies   ?Patient has no known allergies. ? ?Review of Systems ?Review of Systems ?Per HPI ? ?Physical Exam ?Triage Vital Signs ?ED Triage Vitals  ?Enc Vitals Group  ?   BP 07/03/21 1444 (!) 152/79  ?   Pulse Rate 07/03/21 1444 91  ?   Resp 07/03/21 1444 18  ?   Temp 07/03/21 1444 98 ?F (36.7 ?C)  ?   Temp Source 07/03/21 1444 Oral  ?   SpO2 07/03/21 1444 97 %  ?   Weight --   ?   Height --   ?   Head Circumference --   ?   Peak Flow --   ?   Pain Score 07/03/21 1445 7  ?   Pain Loc --   ?   Pain Edu? --   ?   Excl. in GC? --   ? ?No data found. ? ?Updated Vital Signs ?BP (!) 152/79 (BP Location: Right Arm)   Pulse  91   Temp 98 ?F (36.7 ?C) (Oral)   Resp 18   SpO2 97%  ? ?Visual Acuity ?Right Eye Distance:   ?Left Eye Distance:   ?Bilateral Distance:   ? ?Right Eye Near:   ?Left Eye Near:    ?Bilateral Near:    ? ?Physical Exam ?Vitals and nursing note reviewed. Exam conducted with a chaperone present.  ?Constitutional:   ?   Appearance: Normal appearance.  ?HENT:  ?   Head: Atraumatic.  ?Eyes:  ?   Extraocular Movements: Extraocular movements intact.  ?   Conjunctiva/sclera: Conjunctivae normal.  ?Cardiovascular:  ?   Rate and Rhythm: Normal rate and regular rhythm.  ?Pulmonary:  ?   Effort: Pulmonary effort is normal.  ?   Breath sounds: Normal breath sounds.  ?Abdominal:  ?   General: Bowel sounds are normal. There is no distension.  ?   Palpations: Abdomen is soft.  ?   Tenderness: There is no abdominal tenderness. There is no guarding.  ?Genitourinary: ?   Comments: 2 cm oblong mass of the left inguinal region, significantly tender to  palpation.  No erythema, induration, fluctuance.  No scrotal or testicular abnormalities noted ?Musculoskeletal:     ?   General: Normal range of motion.  ?   Cervical back: Normal range of motion and neck supple.  ?Skin: ?   General: Skin is warm and dry.  ?Neurological:  ?   General: No focal deficit present.  ?   Mental Status: He is oriented to person, place, and time.  ?Psychiatric:     ?   Mood and Affect: Mood normal.     ?   Thought Content: Thought content normal.     ?   Judgment: Judgment normal.  ? ?UC Treatments / Results  ?Labs ?(all labs ordered are listed, but only abnormal results are displayed) ?Labs Reviewed - No data to display ? ?EKG ? ? ?Radiology ?No results found. ? ?Procedures ?Procedures (including critical care time) ? ?Medications Ordered in UC ?Medications - No data to display ? ?Initial Impression / Assessment and Plan / UC Course  ?I have reviewed the triage vital signs and the nursing notes. ? ?Pertinent labs & imaging results that were available during my care of the patient were reviewed by me and considered in my medical decision making (see chart for details). ? ?  ? ?He denies any concern for STI, unclear if mass is representative of lymphadenopathy, hernia, cystic structure or other abnormality.  Discussed need for soft tissue imaging for further evaluation of this issue.  [facility] information given in case unable to get in with PCP any sooner.  Discussed strict ED precautions for any worsening symptoms.  Warm compresses, over-the-counter pain relievers recommended. ? ?Final Clinical Impressions(s) / UC Diagnoses  ? ?Final diagnoses:  ?Left inguinal pain  ? ?Discharge Instructions   ?None ?  ? ?ED Prescriptions   ?None ?  ? ?PDMP not reviewed this encounter. ?  ?Particia Nearing, PA-C ?07/03/21 1515 ? ?

## 2021-07-04 ENCOUNTER — Encounter (HOSPITAL_BASED_OUTPATIENT_CLINIC_OR_DEPARTMENT_OTHER): Payer: Self-pay | Admitting: Emergency Medicine

## 2021-07-04 ENCOUNTER — Emergency Department (HOSPITAL_BASED_OUTPATIENT_CLINIC_OR_DEPARTMENT_OTHER)
Admission: EM | Admit: 2021-07-04 | Discharge: 2021-07-05 | Disposition: A | Discharge status: Home / Self Care | Payer: BC Managed Care – PPO | Attending: Emergency Medicine | Admitting: Emergency Medicine

## 2021-07-04 ENCOUNTER — Other Ambulatory Visit: Payer: Self-pay

## 2021-07-04 DIAGNOSIS — R599 Enlarged lymph nodes, unspecified: Secondary | ICD-10-CM | POA: Diagnosis not present

## 2021-07-04 DIAGNOSIS — R590 Localized enlarged lymph nodes: Secondary | ICD-10-CM | POA: Diagnosis not present

## 2021-07-04 NOTE — ED Triage Notes (Signed)
Pt states he has a bump above his pelvic area. Pt. States started 2 weeks ago, but seems to have gotten bigger last wee. Pt states pain 8/10. Pt. States swollen, and red.  ?

## 2021-07-05 LAB — COMPREHENSIVE METABOLIC PANEL
ALT: 41 U/L (ref 0–44)
AST: 23 U/L (ref 15–41)
Albumin: 4.5 g/dL (ref 3.5–5.0)
Alkaline Phosphatase: 91 U/L (ref 38–126)
Anion gap: 10 (ref 5–15)
BUN: 15 mg/dL (ref 6–20)
CO2: 29 mmol/L (ref 22–32)
Calcium: 10.4 mg/dL — ABNORMAL HIGH (ref 8.9–10.3)
Chloride: 98 mmol/L (ref 98–111)
Creatinine, Ser: 1.06 mg/dL (ref 0.61–1.24)
GFR, Estimated: >60 mL/min (ref >60)
Glucose, Bld: 94 mg/dL (ref 70–99)
Potassium: 3.7 mmol/L (ref 3.5–5.1)
Sodium: 137 mmol/L (ref 135–145)
Total Bilirubin: 0.8 mg/dL (ref 0.3–1.2)
Total Protein: 8.7 g/dL — ABNORMAL HIGH (ref 6.5–8.1)

## 2021-07-05 LAB — CBC WITH DIFFERENTIAL/PLATELET
Abs Immature Granulocytes: 0.02 10*3/uL (ref 0.00–0.07)
Basophils Absolute: 0.1 10*3/uL (ref 0.0–0.1)
Basophils Relative: 1 %
Eosinophils Absolute: 0.1 10*3/uL (ref 0.0–0.5)
Eosinophils Relative: 2 %
HCT: 41.2 % (ref 39.0–52.0)
Hemoglobin: 13.6 g/dL (ref 13.0–17.0)
Immature Granulocytes: 0 %
Lymphocytes Relative: 28 %
Lymphs Abs: 2.1 10*3/uL (ref 0.7–4.0)
MCH: 27.3 pg (ref 26.0–34.0)
MCHC: 33 g/dL (ref 30.0–36.0)
MCV: 82.6 fL (ref 80.0–100.0)
Monocytes Absolute: 0.5 10*3/uL (ref 0.1–1.0)
Monocytes Relative: 6 %
Neutro Abs: 4.8 10*3/uL (ref 1.7–7.7)
Neutrophils Relative %: 63 %
Platelets: 365 10*3/uL (ref 150–400)
RBC: 4.99 MIL/uL (ref 4.22–5.81)
RDW: 13.3 % (ref 11.5–15.5)
WBC: 7.6 10*3/uL (ref 4.0–10.5)
nRBC: 0 % (ref 0.0–0.2)

## 2021-07-05 LAB — RAPID HIV SCREEN (HIV 1/2 AB+AG)
HIV 1/2 Antibodies: NONREACTIVE
HIV-1 P24 Antigen - HIV24: NONREACTIVE

## 2021-07-05 LAB — GC/CHLAMYDIA PROBE AMP (~~LOC~~) NOT AT ARMC
Chlamydia: NEGATIVE
Comment: NEGATIVE
Comment: NORMAL
Neisseria Gonorrhea: NEGATIVE

## 2021-07-05 LAB — URINALYSIS, ROUTINE W REFLEX MICROSCOPIC
Bilirubin Urine: NEGATIVE
Glucose, UA: NEGATIVE mg/dL
Hgb urine dipstick: NEGATIVE
Ketones, ur: NEGATIVE mg/dL
Leukocytes,Ua: NEGATIVE
Nitrite: NEGATIVE
Protein, ur: 30 mg/dL — AB
Specific Gravity, Urine: 1.032 — ABNORMAL HIGH (ref 1.005–1.030)
pH: 6 (ref 5.0–8.0)

## 2021-07-05 LAB — RPR
RPR Ser Ql: REACTIVE — AB
RPR Titer: 1:16 {titer}

## 2021-07-05 MED ORDER — KETOROLAC TROMETHAMINE 60 MG/2ML IM SOLN
30.0000 mg | Freq: Once | INTRAMUSCULAR | Status: AC
  Administered 2021-07-05: 30 mg via INTRAMUSCULAR
  Filled 2021-07-05: qty 2

## 2021-07-05 NOTE — Discharge Instructions (Signed)
For pain control you may take 1000 mg of Tylenol every 8 hours as needed or 600 mg of ibuprofen every 6 hours as needed. ?

## 2021-07-05 NOTE — ED Provider Notes (Signed)
?[facility] ?Provider Note ? ?CSN: [id number] ?Arrival date & time: 07/04/21 1950 ? ?Chief Complaint(s) ?Abscess ? ?HPI ?Mario Nguyen is a 31 y.o. male here with 2 weeks of gradually increasing in the left region that has become painful.  No associated redness.  Patient is sexually active with males.  Reports that he was recently checked for STDs and negative.  Denies any penile discharge, dysuria, testicular pain or swelling.  No fevers, chills.  No cuts or trauma to the left lower extremity.  He was last sexually active 1 week ago.  Unprotected sex. ? ?The history is provided by the patient.  ? ?Past Medical History ?Past Medical History:  ?Diagnosis Date  ? Encounter for hepatitis C screening test for low risk patient 06/28/2019  ? ?Patient Active Problem List  ? Diagnosis Date Noted  ? Gastroesophageal reflux disease without esophagitis 05/28/2021  ? Hemorrhoids 08/01/2020  ? Depression, major, single episode, moderate (HCC) 08/01/2020  ? Intertrigo 08/01/2020  ? Annual visit for general adult medical examination with abnormal findings 06/28/2019  ? Screening for STD (sexually transmitted disease) 08/03/2013  ? Tobacco use disorder 08/03/2013  ? ?Home Medication(s) ?Prior to Admission medications   ?Medication Sig Start Date End Date Taking? Authorizing Provider  ?hydrocortisone (ANUSOL-HC) 2.5 % rectal cream Place 1 application rectally 2 (two) times daily. 08/01/20   Anabel Halon, MD  ?omeprazole (PRILOSEC) 20 MG capsule Take 1 capsule (20 mg total) by mouth daily. 05/28/21   Anabel Halon, MD  ?ondansetron (ZOFRAN-ODT) 8 MG disintegrating tablet Take 1 tablet (8 mg total) by mouth every 8 (eight) hours as needed for nausea or vomiting. 04/23/21   Wallis Bamberg, PA-C  ?                                                                                                                                  ?Allergies ?Patient has no known allergies. ? ?Review of Systems ?Review of  Systems ?As noted in HPI ? ?Physical Exam ?Vital Signs  ?I have reviewed the triage vital signs ?BP 115/78   Pulse 79   Temp 98.8 ?F (37.1 ?C)   Resp 16   Ht 5\' 5"  (1.651 m)   Wt 68 kg   SpO2 98%   BMI 24.96 kg/m?  ? ?Physical Exam ?Vitals reviewed. Exam conducted with a chaperone present.  ?Constitutional:   ?   General: He is not in acute distress. ?   Appearance: He is well-developed. He is not diaphoretic.  ?HENT:  ?   Head: Normocephalic and atraumatic.  ?   Right Ear: External ear normal.  ?   Left Ear: External ear normal.  ?   Nose: Nose normal.  ?   Mouth/Throat:  ?   Mouth: Mucous membranes are moist.  ?Eyes:  ?   General: No scleral icterus. ?   Conjunctiva/sclera: Conjunctivae normal.  ?Neck:  ?  Trachea: Phonation normal.  ?Cardiovascular:  ?   Rate and Rhythm: Normal rate and regular rhythm.  ?Pulmonary:  ?   Effort: Pulmonary effort is normal. No respiratory distress.  ?   Breath sounds: No stridor.  ?Abdominal:  ?   General: There is no distension.  ?   Hernia: There is no hernia in the left inguinal area or right inguinal area.  ?Genitourinary: ?   Penis: Circumcised.   ?   Testes: Normal.     ?   Right: Tenderness or swelling not present.     ?   Left: Tenderness or swelling not present.  ?   Epididymis:  ?   Right: No tenderness.  ?   Left: No tenderness.  ?Musculoskeletal:     ?   General: Normal range of motion.  ?   Cervical back: Normal range of motion.  ?Lymphadenopathy:  ?   Lower Body: Right inguinal adenopathy (shotty) present. Left inguinal adenopathy (tender) present.  ?Neurological:  ?   Mental Status: He is alert and oriented to person, place, and time.  ?Psychiatric:     ?   Behavior: Behavior normal.  ? ? ?ED Results and Treatments ?Labs ?(all labs ordered are listed, but only abnormal results are displayed) ?Labs Reviewed  ?URINALYSIS, ROUTINE W REFLEX MICROSCOPIC - Abnormal; Notable for the following components:  ?    Result Value  ? Specific Gravity, Urine 1.032 (*)   ?  Protein, ur 30 (*)   ? All other components within normal limits  ?COMPREHENSIVE METABOLIC PANEL - Abnormal; Notable for the following components:  ? Calcium 10.4 (*)   ? Total Protein 8.7 (*)   ? All other components within normal limits  ?RAPID HIV SCREEN (HIV 1/2 AB+AG)  ?CBC WITH DIFFERENTIAL/PLATELET  ?RPR  ?GC/CHLAMYDIA PROBE AMP ([place]) NOT AT [facility]  ?                                                                                                                       ?EKG ? EKG Interpretation ? ?Date/Time:    ?Ventricular Rate:    ?PR Interval:    ?QRS Duration:   ?QT Interval:    ?QTC Calculation:   ?R Axis:     ?Text Interpretation:   ?  ? ?  ? ?Radiology ?No results found. ? ?Pertinent labs & imaging results that were available during my care of the patient were reviewed by me and considered in my medical decision making (see MDM for details). ? ?Medications Ordered in ED ?Medications  ?ketorolac (TORADOL) injection 30 mg (30 mg Intramuscular Given 07/05/21 0114)  ?                                                               ?                                                                    ?  Procedures ?Procedures ? ?(including critical care time) ? ?Medical Decision Making / ED Course ? ? ? Complexity of Problem: ? ?Co-morbidities/SDOH that complicate the patient evaluation/care: ?Male with male intercourse ? ?Additional history obtained: ?Labs from Feb with negative HIV, RPR. +HSV. Note from UC recommending he come to ED. ? ?Patient's presenting problem/concern and DDX listed below: ?Inguinal mass ?LAD. Not consistent with abscess. Doubt hernia ? ? ?  Complexity of Data: ?  ?Cardiac Monitoring: ?none ? ?Laboratory Tests ordered listed below with my independent interpretation: ?CBC without leukocytosis or anemia.  No thrombocytopenia or other abnormal cell counts. ?Metabolic panel without significant electrolyte derangements or renal sufficiency ?UA without evidence of infection. ?Rapid HIV  negative. ?GC/chlamydia as well as RPR pending ?  ?Imaging Studies ordered listed below with my independent interpretation: ?None ?  ?  ?ED Course:   ? ?Hospitalization Considered:  ?no ? ?Assessment, Intervention, and Reassessment: ?Left inguinal mass ?Most consistent with lymphadenopathy. ?Etiology uncertain at this time. ?No obvious signs of infection. ?Pending GC/chlamydia and RPR ?No empiric treatment at this time ?Refrain from sexual intercourse. ?PCP follow-up recommended  ? ? ?Final Clinical Impression(s) / ED Diagnoses ?Final diagnoses:  ?LAD (lymphadenopathy), inguinal  ? ?The patient appears reasonably screened and/or stabilized for discharge and I doubt any other medical condition or other [facility] requiring further screening, evaluation, or treatment in the ED at this time prior to discharge. Safe for discharge with strict return precautions. ? ?Disposition: Discharge ? ?Condition: Good ? ?I have discussed the results, Dx and Tx plan with the patient/family who expressed understanding and agree(s) with the plan. Discharge instructions discussed at length. The patient/family was given strict return precautions who verbalized understanding of the instructions. No further questions at time of discharge.  ? ? ?ED Discharge Orders   ? ? None  ? ?  ? ? ? ? ?Follow Up: ?Anabel HalonPatel, Rutwik K, MD ?[address] ?[place] [id number] ?[phone] ? ?Call  ?to schedule an appointment for close follow up ? ? ? ?  ? ? ? ? ? ?This chart was dictated using voice recognition software.  Despite best efforts to proofread,  errors can occur which can change the documentation meaning. ? ?  ?Nira Connardama, Lillyian Heidt Eduardo, MD ?07/05/21 [phone] ? ?

## 2021-07-08 ENCOUNTER — Telehealth: Payer: Self-pay

## 2021-07-08 LAB — T.PALLIDUM AB, TOTAL: T Pallidum Abs: REACTIVE — AB

## 2021-07-08 NOTE — Telephone Encounter (Signed)
[employer] forms  (FORM FRONT DESK IN FOLDER APPT FOR 3.21.2023) ? ?Copied ?Noted ?sleeved ?

## 2021-07-08 NOTE — Telephone Encounter (Signed)
Returned call no voice mail could not leave message ?

## 2021-07-09 ENCOUNTER — Ambulatory Visit (INDEPENDENT_AMBULATORY_CARE_PROVIDER_SITE_OTHER): Payer: BC Managed Care – PPO | Admitting: Internal Medicine

## 2021-07-09 ENCOUNTER — Encounter: Payer: Self-pay | Admitting: Internal Medicine

## 2021-07-09 ENCOUNTER — Other Ambulatory Visit: Payer: Self-pay

## 2021-07-09 VITALS — BP 118/84 | HR 77 | Resp 18 | Ht 65.0 in | Wt 152.6 lb

## 2021-07-09 DIAGNOSIS — Z09 Encounter for follow-up examination after completed treatment for conditions other than malignant neoplasm: Secondary | ICD-10-CM

## 2021-07-09 DIAGNOSIS — A539 Syphilis, unspecified: Secondary | ICD-10-CM

## 2021-07-09 DIAGNOSIS — R590 Localized enlarged lymph nodes: Secondary | ICD-10-CM

## 2021-07-09 NOTE — Assessment & Plan Note (Signed)
Unclear if groin mass is inguinal LAD versus hernial sac ?Will check CT abdomen pelvis ?Will refer to general surgery after checking CT abdomen pelvis ?

## 2021-07-09 NOTE — Assessment & Plan Note (Signed)
Inguinal LAD could be due to syphilis ?We will give penicillin G IM -ordered today ?

## 2021-07-09 NOTE — Progress Notes (Signed)
? ?Acute Office Visit ? ?Subjective:  ? ? Patient ID: Mario Nguyen, male    DOB: 19-Jul-1990, 31 y.o.   MRN: [id number] ? ?Chief Complaint  ?Patient presents with  ? Acute Visit  ?  Pt has lump left side of pelvis has been there for 2 weeks went to urgent care in [place] 07-04-21 they sent for testing but was unclear of diagnosis was throbbing pain but no longer giving pt pain   ? ? ?HPI ?Patient is in today for complaint of left-sided groin mass, for which he went to urgent care and later ER.  He was having groin area pain, which was worse with bending and prolonged standing.  He still has a groin mass, but denies any pain now.  ER chart suggest inguinal lymphadenopathy.  His blood test showed positive RPR and T. pallidum abs.  He reports unprotected sexual activity recently as well.  Denies any fever, chills, fatigue, weight loss recently.  HIV testing was negative.  Chlamydia and Neisseria testing was also negative.  He has had syphilis in the past. ? ?He has brought FMLA paperwork, which have been completed today. ? ?Past Medical History:  ?Diagnosis Date  ? Encounter for hepatitis C screening test for low risk patient 06/28/2019  ? ? ?History reviewed. No pertinent surgical history. ? ?Family History  ?Problem Relation Age of Onset  ? Hypertension Mother   ? Learning disabilities Maternal Grandmother   ? Stroke Maternal Grandfather   ? ? ?Social History  ? ?Socioeconomic History  ? Marital status: Single  ?  Spouse name: Not on file  ? Number of children: Not on file  ? Years of education: 10  ? Highest education level: 10th grade  ?Occupational History  ? Not on file  ?Tobacco Use  ? Smoking status: Every Day  ?  Packs/day: 0.50  ?  Years: 6.00  ?  Pack years: 3.00  ?  Types: Cigarettes  ? Smokeless tobacco: Never  ?Vaping Use  ? Vaping Use: Never used  ?Substance and Sexual Activity  ? Alcohol use: Yes  ?  Comment: occasional  ? Drug use: Yes  ?  Frequency: 7.0 times per week  ?  Types: Marijuana  ?  Sexual activity: Yes  ?  Birth control/protection: None  ?Other Topics Concern  ? Not on file  ?Social History Narrative  ? Lives fiance [name] 8 years   ? Dog: Pit Storm y 3 years  ?   ? Enjoys gardening, listening to music, just relaxing.  ?   ? Diet: Well balanced, but does like to eat sweets.  ? Caffeine: Does not drink much tea or sodas, does not drink coffee, most drinks Gatorade.   ? Water: 3-5 bottles of water daily  ?   ? Wears seat belt  ? Does not wear sun protection  ? Smoke detectors  ? Occasional use of phone while driving  ? ?Social Determinants of Health  ? ?Financial Resource Strain: Not on file  ?Food Insecurity: Not on file  ?Transportation Needs: Not on file  ?Physical Activity: Not on file  ?Stress: Not on file  ?Social Connections: Not on file  ?Intimate Partner Violence: Not on file  ? ? ?Outpatient Medications Prior to Visit  ?Medication Sig Dispense Refill  ? hydrocortisone (ANUSOL-HC) 2.5 % rectal cream Place 1 application rectally 2 (two) times daily. 30 g 0  ? omeprazole (PRILOSEC) 20 MG capsule Take 1 capsule (20 mg total) by mouth daily. 30  capsule 3  ? ondansetron (ZOFRAN-ODT) 8 MG disintegrating tablet Take 1 tablet (8 mg total) by mouth every 8 (eight) hours as needed for nausea or vomiting. 20 tablet 0  ? ?No facility-administered medications prior to visit.  ? ? ?No Known Allergies ? ?Review of Systems  ?Constitutional:  Negative for chills and fever.  ?HENT:  Negative for congestion and sore throat.   ?Eyes:  Negative for pain and discharge.  ?Respiratory:  Negative for cough and shortness of breath.   ?Cardiovascular:  Negative for chest pain and palpitations.  ?Gastrointestinal:  Positive for abdominal pain (Left groin). Negative for diarrhea, nausea and vomiting.  ?Endocrine: Negative for polydipsia and polyuria.  ?Genitourinary:  Negative for dysuria and hematuria.  ?Musculoskeletal:  Negative for neck pain and neck stiffness.  ?Skin:  Negative for rash.  ?Neurological:   Negative for dizziness, weakness, numbness and headaches.  ?Psychiatric/Behavioral:  Negative for agitation and behavioral problems.   ? ?   ?Objective:  ?  ?Physical Exam ?Vitals reviewed.  ?Constitutional:   ?   General: He is not in acute distress. ?   Appearance: He is not diaphoretic.  ?HENT:  ?   Head: Normocephalic and atraumatic.  ?   Nose: Nose normal.  ?   Mouth/Throat:  ?   Mouth: Mucous membranes are moist.  ?Eyes:  ?   General: No scleral icterus. ?   Extraocular Movements: Extraocular movements intact.  ?Cardiovascular:  ?   Rate and Rhythm: Normal rate and regular rhythm.  ?   Pulses: Normal pulses.  ?   Heart sounds: Normal heart sounds. No murmur heard. ?Pulmonary:  ?   Breath sounds: Normal breath sounds. No wheezing or rales.  ?Abdominal:  ?   Palpations: Abdomen is soft. There is mass (Left groin, about 2 cm in diameter, nonreducible).  ?   Tenderness: There is no abdominal tenderness.  ?Musculoskeletal:  ?   Cervical back: Neck supple. No tenderness.  ?   Right lower leg: No edema.  ?   Left lower leg: No edema.  ?Skin: ?   General: Skin is warm.  ?   Findings: No rash.  ?Neurological:  ?   General: No focal deficit present.  ?   Mental Status: He is alert and oriented to person, place, and time.  ?   Sensory: No sensory deficit.  ?   Motor: No weakness.  ?Psychiatric:     ?   Mood and Affect: Mood normal.     ?   Behavior: Behavior normal.  ? ? ?BP 118/84 (BP Location: Left Arm, Patient Position: Sitting, Cuff Size: Normal)   Pulse 77   Resp 18   Ht 5\' 5"  (1.651 m)   Wt 152 lb 9.6 oz (69.2 kg)   SpO2 99%   BMI 25.39 kg/m?  ?Wt Readings from Last 3 Encounters:  ?07/09/21 152 lb 9.6 oz (69.2 kg)  ?07/04/21 150 lb (68 kg)  ?05/28/21 150 lb 0.6 oz (68.1 kg)  ? ? ? ?   ?Assessment & Plan:  ? ?Problem List Items Addressed This Visit   ? ?  ? Immune and Lymphatic  ? LAD (lymphadenopathy), inguinal  ?  Unclear if groin mass is inguinal LAD versus hernial sac ?Will check CT abdomen pelvis ?Will  refer to general surgery after checking CT abdomen pelvis ?  ?  ? Relevant Orders  ? CT Abdomen Pelvis W Contrast  ?  ? Other  ? Encounter for examination following treatment  at hospital - Primary  ?  Urgent care and ER chart reviewed ?STD screening test showed positive RPR and T. pallidum antibody testing ?  ?  ? Syphilis  ?  Inguinal LAD could be due to syphilis ?We will give penicillin G IM -ordered today ?  ?  ? ? ? ?No orders of the defined types were placed in this encounter. ? ? ? ?Anabel Halon, MD ?

## 2021-07-09 NOTE — Assessment & Plan Note (Signed)
Urgent care and ER chart reviewed ?STD screening test showed positive RPR and T. pallidum antibody testing ?

## 2021-07-10 ENCOUNTER — Ambulatory Visit (INDEPENDENT_AMBULATORY_CARE_PROVIDER_SITE_OTHER): Payer: BC Managed Care – PPO | Admitting: *Deleted

## 2021-07-10 DIAGNOSIS — A539 Syphilis, unspecified: Secondary | ICD-10-CM

## 2021-07-10 DIAGNOSIS — Z0279 Encounter for issue of other medical certificate: Secondary | ICD-10-CM

## 2021-07-10 MED ORDER — PENICILLIN G BENZATHINE 2400000 UNIT/4ML IM SUSP
2.4000 10*6.[IU] | Freq: Once | INTRAMUSCULAR | Status: AC
  Administered 2021-07-10: 2400000 [IU] via INTRAMUSCULAR

## 2021-07-10 MED ORDER — PENICILLIN G BENZATHINE 1200000 UNIT/2ML IM SUSY: 2.4000 10*6.[IU] | PREFILLED_SYRINGE | Freq: Once | INTRAMUSCULAR | Status: DC

## 2021-07-10 NOTE — Telephone Encounter (Signed)
Patient was in office need forms to take to work. Patient ask to bill. Took forms to work. ?

## 2021-07-16 ENCOUNTER — Ambulatory Visit (HOSPITAL_COMMUNITY)
Admission: RE | Admit: 2021-07-16 | Discharge: 2021-07-16 | Disposition: A | Discharge status: Home / Self Care | Payer: BC Managed Care – PPO | Source: Ambulatory Visit | Attending: Internal Medicine | Admitting: Internal Medicine

## 2021-07-16 ENCOUNTER — Other Ambulatory Visit: Payer: Self-pay

## 2021-07-16 DIAGNOSIS — R590 Localized enlarged lymph nodes: Secondary | ICD-10-CM | POA: Diagnosis not present

## 2021-07-16 DIAGNOSIS — R1909 Other intra-abdominal and pelvic swelling, mass and lump: Secondary | ICD-10-CM | POA: Diagnosis not present

## 2021-07-16 MED ORDER — IOHEXOL 300 MG/ML  SOLN
100.0000 mL | Freq: Once | INTRAMUSCULAR | Status: AC | PRN
  Administered 2021-07-16: 80 mL via INTRAVENOUS

## 2021-07-22 ENCOUNTER — Telehealth: Payer: BC Managed Care – PPO | Admitting: Physician Assistant

## 2021-07-22 DIAGNOSIS — R21 Rash and other nonspecific skin eruption: Secondary | ICD-10-CM

## 2021-07-22 DIAGNOSIS — A539 Syphilis, unspecified: Secondary | ICD-10-CM

## 2021-07-22 NOTE — Progress Notes (Signed)
Based on what you shared with me, I feel your condition warrants further evaluation and I recommend that you be seen in a face to face visit. ? ?Giving new rash after recent syphilis treatment you need repeat evaluation in person for a detailed examination of rash and to make sure proper treatment is given.  ?  ?NOTE: There will be NO CHARGE for this eVisit ?  ?If you are having a true medical emergency please call 911.   ?  ? For an urgent face to face visit, [place] has six urgent care centers for your convenience:  ?  ? [facility] ?Get Driving Directions ?[phone] ?[id number] ?[place], [place] [address] ?  ? [facility]) ?Get Driving Directions ?[phone] ?[address] ?[place], [place] [address] ? ?[facility]) ?Get Driving Directions ?[phone] ?[address] ?[place],  [place]  [address] ? ?[facility] ?Get Driving Directions ?[phone] ?[address] ?[place], [place] [address] ?  ?[facility] [place] ?Get Driving Directions  ?[phone] ?[address] ?[address] ?[place], [place] [address] ?  ?[facility] ?Get Driving Directions ?[phone] ?[address] ?[place], [place] [address] ? ?Your MyChart E-visit questionnaire answers were reviewed by a board certified advanced clinical practitioner to complete your personal care plan based on your specific symptoms.  Thank you for using e-Visits. ?  ? ?

## 2021-07-23 ENCOUNTER — Encounter: Payer: Self-pay | Admitting: Emergency Medicine

## 2021-07-23 ENCOUNTER — Ambulatory Visit
Admission: EM | Admit: 2021-07-23 | Discharge: 2021-07-23 | Disposition: A | Discharge status: Home / Self Care | Payer: BC Managed Care – PPO | Attending: Family Medicine | Admitting: Family Medicine

## 2021-07-23 ENCOUNTER — Other Ambulatory Visit: Payer: Self-pay

## 2021-07-23 DIAGNOSIS — R519 Headache, unspecified: Secondary | ICD-10-CM

## 2021-07-23 DIAGNOSIS — Z8619 Personal history of other infectious and parasitic diseases: Secondary | ICD-10-CM

## 2021-07-23 DIAGNOSIS — R52 Pain, unspecified: Secondary | ICD-10-CM

## 2021-07-23 DIAGNOSIS — R21 Rash and other nonspecific skin eruption: Secondary | ICD-10-CM

## 2021-07-23 MED ORDER — PENICILLIN G BENZATHINE 1200000 UNIT/2ML IM SUSY
2.4000 10*6.[IU] | PREFILLED_SYRINGE | Freq: Once | INTRAMUSCULAR | Status: AC
  Administered 2021-07-23: 2.4 10*6.[IU] via INTRAMUSCULAR

## 2021-07-23 NOTE — ED Provider Notes (Signed)
?[facility] ? ? ? ?CSN: [id number] ?Arrival date & time: 07/23/21  0847 ? ? ?  ? ?History   ?Chief Complaint ?Chief Complaint  ?Patient presents with  ? Urticaria  ? ? ?HPI ?Lejon L Gilkerson is a 31 y.o. male.  ? ?Presenting today concern for a rash that has been progressing over the past week across his body, including extremities, trunk, face.  He denies any new products, exposures, foods, medications, sick contacts.  Sometimes the areas itch but they are largely asymptomatic.  He has not been trying anything over-the-counter for this.  Only new change recently as he was diagnosed with syphilis about 2 weeks ago and treated on 07/04/2021 for this.  He is also now complaining of headaches, generalized body aches additionally. ? ? ?Past Medical History:  ?Diagnosis Date  ? Encounter for hepatitis C screening test for low risk patient 06/28/2019  ? ? ?Patient Active Problem List  ? Diagnosis Date Noted  ? Encounter for examination following treatment at hospital 07/09/2021  ? Syphilis 07/09/2021  ? LAD (lymphadenopathy), inguinal 07/09/2021  ? Gastroesophageal reflux disease without esophagitis 05/28/2021  ? Hemorrhoids 08/01/2020  ? Depression, major, single episode, moderate (HCC) 08/01/2020  ? Intertrigo 08/01/2020  ? Annual visit for general adult medical examination with abnormal findings 06/28/2019  ? Screening for STD (sexually transmitted disease) 08/03/2013  ? Tobacco use disorder 08/03/2013  ? ? ?History reviewed. No pertinent surgical history. ? ? ? ? ?Home Medications   ? ?Prior to Admission medications   ?Medication Sig Start Date End Date Taking? Authorizing Provider  ?hydrocortisone (ANUSOL-HC) 2.5 % rectal cream Place 1 application rectally 2 (two) times daily. 08/01/20   Anabel Halon, MD  ?omeprazole (PRILOSEC) 20 MG capsule Take 1 capsule (20 mg total) by mouth daily. 05/28/21   Anabel Halon, MD  ?ondansetron (ZOFRAN-ODT) 8 MG disintegrating tablet Take 1 tablet (8 mg total) by mouth  every 8 (eight) hours as needed for nausea or vomiting. 04/23/21   Wallis Bamberg, PA-C  ? ? ?Family History ?Family History  ?Problem Relation Age of Onset  ? Hypertension Mother   ? Learning disabilities Maternal Grandmother   ? Stroke Maternal Grandfather   ? ? ?Social History ?Social History  ? ?Tobacco Use  ? Smoking status: Every Day  ?  Packs/day: 0.50  ?  Years: 6.00  ?  Pack years: 3.00  ?  Types: Cigarettes  ? Smokeless tobacco: Never  ?Vaping Use  ? Vaping Use: Never used  ?Substance Use Topics  ? Alcohol use: Yes  ?  Comment: occasional  ? Drug use: Yes  ?  Frequency: 7.0 times per week  ?  Types: Marijuana  ? ? ? ?Allergies   ?Patient has no known allergies. ? ? ?Review of Systems ?Review of Systems ?Per HPI ? ?Physical Exam ?Triage Vital Signs ?ED Triage Vitals  ?Enc Vitals Group  ?   BP 07/23/21 0958 127/68  ?   Pulse Rate 07/23/21 0958 (!) 101  ?   Resp 07/23/21 0958 18  ?   Temp 07/23/21 0958 98.6 ?F (37 ?C)  ?   Temp Source 07/23/21 0958 Oral  ?   SpO2 07/23/21 0958 97 %  ?   Weight 07/23/21 0959 150 lb (68 kg)  ?   Height 07/23/21 0959 5\' 5"  (1.651 m)  ?   Head Circumference --   ?   Peak Flow --   ?   Pain Score 07/23/21 0959 8  ?  Pain Loc --   ?   Pain Edu? --   ?   Excl. in GC? --   ? ?No data found. ? ?Updated Vital Signs ?BP 127/68 (BP Location: Right Arm)   Pulse (!) 101   Temp 98.6 ?F (37 ?C) (Oral)   Resp 18   Ht 5\' 5"  (1.651 m)   Wt 150 lb (68 kg)   SpO2 97%   BMI 24.96 kg/m?  ? ?Visual Acuity ?Right Eye Distance:   ?Left Eye Distance:   ?Bilateral Distance:   ? ?Right Eye Near:   ?Left Eye Near:    ?Bilateral Near:    ? ?Physical Exam ?Vitals and nursing note reviewed.  ?Constitutional:   ?   Appearance: Normal appearance.  ?HENT:  ?   Head: Atraumatic.  ?   Mouth/Throat:  ?   Mouth: Mucous membranes are moist.  ?   Pharynx: Oropharynx is clear.  ?Eyes:  ?   Extraocular Movements: Extraocular movements intact.  ?   Conjunctiva/sclera: Conjunctivae normal.  ?Cardiovascular:  ?   Rate  and Rhythm: Normal rate and regular rhythm.  ?Pulmonary:  ?   Effort: Pulmonary effort is normal.  ?   Breath sounds: Normal breath sounds.  ?Musculoskeletal:     ?   General: Normal range of motion.  ?   Cervical back: Normal range of motion and neck supple.  ?Skin: ?   General: Skin is warm and dry.  ?   Findings: Rash present.  ?   Comments: Erythematous maculopapular rash in small circular pattern sporadically spread across extremities, trunk, face, some with central ulceration and scabbing.  Rash spares the palms and the soles  ?Neurological:  ?   General: No focal deficit present.  ?   Mental Status: He is oriented to person, place, and time.  ?Psychiatric:     ?   Mood and Affect: Mood normal.     ?   Thought Content: Thought content normal.     ?   Judgment: Judgment normal.  ? ? ? ?UC Treatments / Results  ?Labs ?(all labs ordered are listed, but only abnormal results are displayed) ?Labs Reviewed  ?RPR  ?HIV ANTIBODY (ROUTINE TESTING W REFLEX)  ? ? ?EKG ? ? ?Radiology ?No results found. ? ?Procedures ?Procedures (including critical care time) ? ?Medications Ordered in UC ?Medications  ?penicillin g benzathine (BICILLIN LA) 1200000 UNIT/2ML injection 2.4 Million Units (2.4 Million Units Intramuscular Given 07/23/21 1040)  ? ? ?Initial Impression / Assessment and Plan / UC Course  ?I have reviewed the triage vital signs and the nursing notes. ? ?Pertinent labs & imaging results that were available during my care of the patient were reviewed by me and considered in my medical decision making (see chart for details). ? ?  ? ?We will recheck HIV and syphilis labs to monitor for changes, he denies any new sexual partners since previous testing and treatment.  We will treat with 1 more dose of penicillin G as the rash does appear concerning for ongoing syphilis symptoms, as well as a headache and body aches.  Close follow-up with PCP recommended for recheck symptoms. ? ?Final Clinical Impressions(s) / UC Diagnoses   ? ?Final diagnoses:  ?Generalized rash  ?Acute nonintractable headache, unspecified headache type  ?Generalized body aches  ?History of syphilis  ?Rash  ? ?Discharge Instructions   ?None ?  ? ?ED Prescriptions   ?None ?  ? ?PDMP not reviewed this encounter. ?  ?Maurice MarchLane,  Salley Hews, PA-C ?07/23/21 1342 ? ?

## 2021-07-23 NOTE — ED Triage Notes (Signed)
Pt reports tested positive for syphilis and reports was given an injection end of march. Pt reports generalized hives, headache started a few days ago and denies any new self care products.  ?

## 2021-07-25 LAB — RPR: RPR Ser Ql: REACTIVE — AB

## 2021-07-25 LAB — HIV ANTIBODY (ROUTINE TESTING W REFLEX): HIV Screen 4th Generation wRfx: NONREACTIVE

## 2021-07-25 LAB — RPR, QUANT+TP ABS (REFLEX)
Rapid Plasma Reagin, Quant: 1:16 {titer} — ABNORMAL HIGH
T Pallidum Abs: REACTIVE — AB

## 2021-07-26 ENCOUNTER — Encounter: Payer: Self-pay | Admitting: Internal Medicine

## 2021-07-29 ENCOUNTER — Telehealth: Payer: Self-pay

## 2021-07-29 ENCOUNTER — Other Ambulatory Visit: Payer: Self-pay | Admitting: *Deleted

## 2021-07-29 DIAGNOSIS — A539 Syphilis, unspecified: Secondary | ICD-10-CM

## 2021-07-29 NOTE — Telephone Encounter (Signed)
[employer] claim # [id number] ? ?Dates 03.17.2023  - days out from work. ? ?Copied ?Noted ?Sleeved ? ?

## 2021-08-01 ENCOUNTER — Ambulatory Visit: Payer: BC Managed Care – PPO | Admitting: Internal Medicine

## 2021-08-06 ENCOUNTER — Ambulatory Visit: Payer: BC Managed Care – PPO | Admitting: Internal Medicine

## 2021-08-13 ENCOUNTER — Other Ambulatory Visit (HOSPITAL_COMMUNITY): Payer: Self-pay

## 2021-08-14 ENCOUNTER — Ambulatory Visit (INDEPENDENT_AMBULATORY_CARE_PROVIDER_SITE_OTHER): Payer: BC Managed Care – PPO | Admitting: Internal Medicine

## 2021-08-14 ENCOUNTER — Other Ambulatory Visit (HOSPITAL_COMMUNITY)
Admission: RE | Admit: 2021-08-14 | Discharge: 2021-08-14 | Disposition: A | Discharge status: Home / Self Care | Payer: BC Managed Care – PPO | Source: Ambulatory Visit | Attending: Internal Medicine | Admitting: Internal Medicine

## 2021-08-14 ENCOUNTER — Encounter: Payer: Self-pay | Admitting: Internal Medicine

## 2021-08-14 ENCOUNTER — Other Ambulatory Visit: Payer: Self-pay

## 2021-08-14 VITALS — BP 116/73 | HR 78 | Temp 97.6°F | Ht 65.0 in | Wt 150.0 lb

## 2021-08-14 DIAGNOSIS — Z114 Encounter for screening for human immunodeficiency virus [HIV]: Secondary | ICD-10-CM | POA: Diagnosis not present

## 2021-08-14 DIAGNOSIS — Z7252 High risk homosexual behavior: Secondary | ICD-10-CM

## 2021-08-14 DIAGNOSIS — Z7189 Other specified counseling: Secondary | ICD-10-CM | POA: Diagnosis not present

## 2021-08-14 DIAGNOSIS — A539 Syphilis, unspecified: Secondary | ICD-10-CM

## 2021-08-14 DIAGNOSIS — Z113 Encounter for screening for infections with a predominantly sexual mode of transmission: Secondary | ICD-10-CM | POA: Insufficient documentation

## 2021-08-14 MED ORDER — EMTRICITABINE-TENOFOVIR AF 200-25 MG PO TABS: 1.0000 | ORAL_TABLET | Freq: Every day | ORAL | 11 refills | Status: DC

## 2021-08-14 MED ORDER — DOXYCYCLINE HYCLATE 100 MG PO TABS: 100.0000 mg | ORAL_TABLET | Freq: Two times a day (BID) | ORAL | 3 refills | Status: DC

## 2021-08-14 NOTE — Progress Notes (Signed)
?  ? ? ? ? ?[facility] ? ?Reason for Consult:syphilis ?Referring Provider: Ihor Dow ? ? ? ?Patient Active Problem List  ? Diagnosis Date Noted  ? Encounter for examination following treatment at hospital 07/09/2021  ? Syphilis 07/09/2021  ? LAD (lymphadenopathy), inguinal 07/09/2021  ? Gastroesophageal reflux disease without esophagitis 05/28/2021  ? Hemorrhoids 08/01/2020  ? Depression, major, single episode, moderate ([place]) 08/01/2020  ? Intertrigo 08/01/2020  ? Annual visit for general adult medical examination with abnormal findings 06/28/2019  ? Screening for STD (sexually transmitted disease) 08/03/2013  ? Tobacco use disorder 08/03/2013  ? ? ? ? ?HPI: Mario Nguyen is a 31 y.o. male referred here from his pcp for syphilis ? ? ?I reviewed chart and labs ?He has a nonreactive rprin 05/28/21 and 07/2020 ?He then had a reactive rpr with titer 1:16 in 06/2021 and 07/23/2021 with positive fta as well ?His other relevant labs: ?Hep b sAb positive in 05/28/2021; hep b sAg negative in 07/2020 ?Hep c ab negative in 07/2020 ?Hiv nonreactive in 06/2021 ? ?He practiced receptive oral/anal intercourse. Msm ? ?When he was positive 06/2021 he got one penicillin shot sometimes 3rd week of the month ?He retested in 07/2021 as he developed a rash on face/trunk/upper extremities but not palm/sole. He had another benzathine pcn shot at urgent care [facility] [place] during 08/04/21 visit. The rash is receding. He admits to having unprotected sex after march 2023 ? ? ?He denies eye pain/redness/visual change/hearing change. No penile discharge/rectal discharge or pain ? ?He had left groin lump nontender that is improving ? ?He is interested in prep (never takes -- doesn't like needle) and doxycycline pep ? ?No hx of substance use ? ? ?Review of Systems: ?ROS ?Other ros negative ? ? ? ? ? ?Past Medical History:  ?Diagnosis Date  ? Encounter for hepatitis C screening test for low risk patient 06/28/2019   ? ? ?Social History  ? ?Tobacco Use  ? Smoking status: Every Day  ?  Packs/day: 0.50  ?  Years: 6.00  ?  Pack years: 3.00  ?  Types: Cigarettes  ? Smokeless tobacco: Never  ?Vaping Use  ? Vaping Use: Never used  ?Substance Use Topics  ? Alcohol use: Yes  ?  Comment: occasional  ? Drug use: Yes  ?  Frequency: 7.0 times per week  ?  Types: Marijuana  ? ? ?Family History  ?Problem Relation Age of Onset  ? Hypertension Mother   ? Learning disabilities Maternal Grandmother   ? Stroke Maternal Grandfather   ? ? ?No Known Allergies ? ?OBJECTIVE: ?There were no vitals filed for this visit. ?There is no height or weight on file to calculate BMI. ? ? ?Physical Exam ?General/constitutional: no distress, pleasant ?HEENT: Normocephalic, PER, Conj Clear, EOMI, Oropharynx clear ?Neck supple ?CV: rrr no mrg ?Lungs: clear to auscultation, normal respiratory effort ?Abd: Soft, Nontender ?Ext: no edema ?Skin: round well demarcated squamous rash on trunk/arms. Left groin inguinal lump/lymph node nontender -- no ulceration or groove sign ?Neuro: nonfocal ?MSK: no peripheral joint swelling/tenderness/warmth; back spines nontender ? ? ? ?Lab: ?Lab Results  ?Component Value Date  ? WBC 7.6 07/05/2021  ? HGB 13.6 07/05/2021  ? HCT 41.2 07/05/2021  ? MCV 82.6 07/05/2021  ? PLT 365 07/05/2021  ? ?Last metabolic panel ?Lab Results  ?Component Value Date  ? GLUCOSE 94 07/05/2021  ? NA 137 07/05/2021  ? K 3.7 07/05/2021  ? CL 98 07/05/2021  ?  CO2 29 07/05/2021  ? BUN 15 07/05/2021  ? CREATININE 1.06 07/05/2021  ? GFRNONAA >60 07/05/2021  ? CALCIUM 10.4 (H) 07/05/2021  ? PROT 8.7 (H) 07/05/2021  ? ALBUMIN 4.5 07/05/2021  ? LABGLOB 2.7 05/28/2021  ? AGRATIO 1.6 05/28/2021  ? BILITOT 0.8 07/05/2021  ? ALKPHOS 91 07/05/2021  ? AST 23 07/05/2021  ? ALT 41 07/05/2021  ? ANIONGAP 10 07/05/2021  ? ? ?Microbiology: ? ?Serology: ? ?Imaging: ? ? ?Assessment/plan: ?Problem List Items Addressed This Visit   ? ?  ? Other  ? Syphilis - Primary  ? ?Other Visit  Diagnoses   ? ? High risk homosexual behavior      ? Encounter for HIV screening and discussion of pre-exposure prophylaxis for HIV      ? ?  ? ? ? ? ?Patient had syphilis in 06/2021 primary (assymptomatic) then reexposed in 08/04/2021 with rash and treated. No sign/sx of disseminated syphilis to cns/eye. ? ?He has left inguinal node which is nontender without penile/gu ulceration. Suspect related to syphilis, but will need triple screen for gc/chlamydia ? ?He is interested in PEP and PrEP. He never took prep before ? ?I also discussed if he could help spread the word about prep to his friends and we as a clinic could provide for those as needed ? ? ?-triple screen gc/chlamydia ?-retest hepatitis  panel; if not immunized against B will vaccinate ?-PreP descovy rx and PEP doxy given ?-avoid sex for the next 2 weeks while we are screening for all std's ? ? ? ? ? ?Follow-up: Return if symptoms worsen or fail to improve. ? ?I have spent a total of 65 minutes of face-to-face and non-face-to-face time, excluding clinical staff time, preparing to see patient, ordering tests and/or medications, and provide counseling the patient  ? ? ?Jabier Mutton, MD ?[facility] ?[place] ?[phone] pager   [phone] cell ?08/14/2021, 9:43 AM ? ?

## 2021-08-14 NOTE — Patient Instructions (Addendum)
You were adequately treated for syphilis. The rash should continue to go away ? ?Will screen for other sexually infection transmission today ? ? ?Will start these two prophylactic medications as below: ?1) for hiv reduction, take descovy 1 tablet once a day -- this will reduce hiv transmision by more than 90% ? ?2) take doxycycline 200 mg (2 tablet) once, ASAP within 3 days of unprotected sex to reduce things like syphilis, gonorrhea, and chlamydia (I wrote prescription as 100 mg twice a day so you can get 20 pills at a time) ? ? ?If you have any friend you know that could benefit from descovy and doxycycline please refer them here to [place], even if they don't have insurance ? ? ?For your groin lymph node swelling. Monitor for any open wound/ulcers around that area and let us know. We are doing testing as well but these are not 100% sensitive ? ? ?Follow up with [place] as needed if any concern about infection ? ? ?Avoid sex within the next 2 weeks until we rule out all other infections ?

## 2021-08-15 LAB — HIV ANTIBODY (ROUTINE TESTING W REFLEX): HIV 1&2 Ab, 4th Generation: NONREACTIVE

## 2021-08-15 LAB — HEPATITIS A ANTIBODY, TOTAL: Hepatitis A AB,Total: NONREACTIVE

## 2021-08-15 LAB — CYTOLOGY, (ORAL, ANAL, URETHRAL) ANCILLARY ONLY
Chlamydia: NEGATIVE
Comment: NEGATIVE
Comment: NORMAL
Neisseria Gonorrhea: NEGATIVE

## 2021-08-15 LAB — HEPATITIS C ANTIBODY
Hepatitis C Ab: NONREACTIVE
SIGNAL TO CUT-OFF: 0.19 (ref <1.00)

## 2021-08-15 LAB — HEPATITIS B SURFACE ANTIGEN: Hepatitis B Surface Ag: NONREACTIVE

## 2021-08-15 LAB — URINE CYTOLOGY ANCILLARY ONLY
Chlamydia: NEGATIVE
Comment: NEGATIVE
Comment: NEGATIVE
Comment: NORMAL
Neisseria Gonorrhea: NEGATIVE
Trichomonas: NEGATIVE

## 2021-08-15 LAB — HEPATITIS B CORE ANTIBODY, TOTAL: Hep B Core Total Ab: REACTIVE — AB

## 2021-08-16 LAB — CYTOLOGY, (ORAL, ANAL, URETHRAL) ANCILLARY ONLY
Chlamydia: NEGATIVE
Comment: NEGATIVE
Comment: NORMAL
Neisseria Gonorrhea: NEGATIVE

## 2021-08-19 ENCOUNTER — Encounter: Payer: Self-pay | Admitting: Internal Medicine

## 2021-08-20 ENCOUNTER — Telehealth: Payer: Self-pay

## 2021-08-20 ENCOUNTER — Other Ambulatory Visit (HOSPITAL_COMMUNITY): Payer: Self-pay

## 2021-08-20 NOTE — Telephone Encounter (Signed)
[facility] Patient Advocate Encounter ?  ?Received notification from [employer] that prior authorization for Descovy is required. ?  ?PA submitted on 08/20/2021 ?Key [id number] ?Status is pending ? ?When medication is approved call [facility] @ [phone] ?RX # [phone] ?   ?[facility] will continue to follow. ? ? ?Clearance Coots, CPhT ?Specialty Pharmacy Patient Advocate ?[facility] ?Phone: [phone] ?Fax:  [phone]  ?

## 2021-08-21 ENCOUNTER — Other Ambulatory Visit (HOSPITAL_COMMUNITY): Payer: Self-pay

## 2021-08-23 ENCOUNTER — Telehealth: Payer: Self-pay

## 2021-08-23 NOTE — Telephone Encounter (Signed)
Call received from [place] at [facility], following up on prior authorization for patient's Descovy. ? ?Stated prior authorization was denied on 08/20/21. ? ?Provided the following information for the appeal department: ? ?Phone: [phone] ?Fax: [phone] ? ?Wyvonne Lenz, RN  ?

## 2021-08-23 NOTE — Telephone Encounter (Signed)
I just received a denial , patient will have to try Truvada 1st and fail.

## 2021-08-23 NOTE — Telephone Encounter (Signed)
[facility] Patient Advocate Encounter ? ?Received notification from [employer] that the request for prior authorization for Descovy has been denied due to patient will need to try and fail Truvada first. Insurance will need to see chart notes saying patient have failed and why.   ?  ? ?This encounter will continue to be updated until final determination.   ? ?Clearance Coots, CPhT ?Specialty Pharmacy Patient Advocate ?[facility] ?Phone: [phone] ?Fax:  [phone]  ?

## 2021-08-26 ENCOUNTER — Other Ambulatory Visit: Payer: Self-pay

## 2021-08-26 MED ORDER — EMTRICITABINE-TENOFOVIR DF 200-300 MG PO TABS: 1.0000 | ORAL_TABLET | Freq: Every day | ORAL | 0 refills | Status: DC

## 2021-08-26 NOTE — Telephone Encounter (Signed)
I have sent in Truvada for the patient and schedule him for a 1 month follow up with you. ?Mario Nguyen T Kendy Haston ? ?

## 2021-09-10 ENCOUNTER — Encounter: Payer: Self-pay | Admitting: Internal Medicine

## 2021-09-10 ENCOUNTER — Ambulatory Visit (INDEPENDENT_AMBULATORY_CARE_PROVIDER_SITE_OTHER): Payer: BC Managed Care – PPO | Admitting: Internal Medicine

## 2021-09-10 VITALS — BP 112/78 | HR 79 | Resp 18 | Ht 65.0 in | Wt 151.4 lb

## 2021-09-10 DIAGNOSIS — Z0001 Encounter for general adult medical examination with abnormal findings: Secondary | ICD-10-CM

## 2021-09-10 DIAGNOSIS — F17200 Nicotine dependence, unspecified, uncomplicated: Secondary | ICD-10-CM | POA: Diagnosis not present

## 2021-09-10 DIAGNOSIS — E559 Vitamin D deficiency, unspecified: Secondary | ICD-10-CM | POA: Diagnosis not present

## 2021-09-10 NOTE — Patient Instructions (Signed)
Please continue taking medications as prescribed. ? ?Please continue to follow heart healthy diet and perform moderate exercise/walking at least 150 mins/week. ? ?Please get fasting blood tests done within a week. ?

## 2021-09-10 NOTE — Assessment & Plan Note (Signed)
Physical exam as documented. Fasting blood tests ordered. 

## 2021-09-10 NOTE — Progress Notes (Signed)
Established Patient Office Visit  Subjective:  Patient ID: Mario Nguyen, male    DOB: [birth year]  Age: 31 y.o. MRN: [id number]  CC:  Chief Complaint  Patient presents with   Annual Exam    Annual exam     HPI Mario Nguyen is a 31 y.o. male with past medical history of depression and tobacco abuse who presents for annual physical.  He was evaluated by ID clinic for syphilis.  He has recent penicillin G x 2 now.  He is inguinal LAD has resolved now.  Denies any rash currently.  Denies any dysuria, hematuria or urethral discharge currently.  He has started Truvada for PrEP.  He has been smoking about 6-7 cigarettes/day.     Past Medical History:  Diagnosis Date   Encounter for hepatitis C screening test for low risk patient 06/28/2019    History reviewed. No pertinent surgical history.  Family History  Problem Relation Age of Onset   Hypertension Mother    Learning disabilities Maternal Grandmother    Stroke Maternal Grandfather     Social History   Socioeconomic History   Marital status: Single    Spouse name: Not on file   Number of children: Not on file   Years of education: 10   Highest education level: 10th grade  Occupational History   Not on file  Tobacco Use   Smoking status: Every Day    Packs/day: 0.30    Years: 6.00    Pack years: 1.80    Types: Cigarettes   Smokeless tobacco: Never   Tobacco comments:    States working on quitting  Scientific laboratory technician Use: Never used  Substance and Sexual Activity   Alcohol use: Yes    Comment: occasional   Drug use: Yes    Frequency: 7.0 times per week    Types: Marijuana    Comment: occasional   Sexual activity: Yes    Birth control/protection: None    Comment: declined condoms  Other Topics Concern   Not on file  Social History Narrative   Lives fiance [place] 8 years    Dog: [place] Storm y 3 years      Enjoys gardening, listening to music, just relaxing.      Diet: Well balanced, but  does like to eat sweets.   Caffeine: Does not drink much tea or sodas, does not drink coffee, most drinks Gatorade.    Water: 3-5 bottles of water daily      Wears seat belt   Does not wear sun protection   Smoke detectors   Occasional use of phone while driving   Social Determinants of Radio broadcast assistant Strain: Not on file  Food Insecurity: Not on file  Transportation Needs: Not on file  Physical Activity: Not on file  Stress: Not on file  Social Connections: Not on file  Intimate Partner Violence: Not on file    Outpatient Medications Prior to Visit  Medication Sig Dispense Refill   emtricitabine-tenofovir (TRUVADA) 200-300 MG tablet Take 1 tablet by mouth daily. 30 tablet 0   doxycycline (VIBRA-TABS) 100 MG tablet Take 1 tablet (100 mg total) by mouth 2 (two) times daily. Take 200 mg (2 tablet) once within 3 days (as soon as possible) after unprotected sex (Patient not taking: Reported on 09/10/2021) 20 tablet 3   hydrocortisone (ANUSOL-HC) 2.5 % rectal cream Place 1 application rectally 2 (two) times daily. (Patient not taking: Reported on 08/14/2021) 30  g 0   omeprazole (PRILOSEC) 20 MG capsule Take 1 capsule (20 mg total) by mouth daily. (Patient not taking: Reported on 08/14/2021) 30 capsule 3   ondansetron (ZOFRAN-ODT) 8 MG disintegrating tablet Take 1 tablet (8 mg total) by mouth every 8 (eight) hours as needed for nausea or vomiting. (Patient not taking: Reported on 08/14/2021) 20 tablet 0   No facility-administered medications prior to visit.    No Known Allergies  ROS Review of Systems  Constitutional:  Negative for chills and fever.  HENT:  Negative for congestion and sore throat.   Eyes:  Negative for pain and discharge.  Respiratory:  Negative for cough and shortness of breath.   Cardiovascular:  Negative for chest pain and palpitations.  Gastrointestinal:  Negative for constipation, diarrhea, nausea and vomiting.  Endocrine: Negative for polydipsia and  polyuria.  Genitourinary:  Negative for dysuria and hematuria.  Musculoskeletal:  Negative for neck pain and neck stiffness.  Skin:  Negative for rash.  Neurological:  Negative for dizziness, weakness, numbness and headaches.  Psychiatric/Behavioral:  Negative for agitation, behavioral problems, decreased concentration, dysphoric mood, sleep disturbance and suicidal ideas.      Objective:    Physical Exam Vitals reviewed.  Constitutional:      General: He is not in acute distress.    Appearance: He is not diaphoretic.  HENT:     Head: Normocephalic and atraumatic.     Nose: Nose normal.     Mouth/Throat:     Mouth: Mucous membranes are moist.  Eyes:     General: No scleral icterus.    Extraocular Movements: Extraocular movements intact.  Cardiovascular:     Rate and Rhythm: Normal rate and regular rhythm.     Pulses: Normal pulses.     Heart sounds: Normal heart sounds. No murmur heard. Pulmonary:     Breath sounds: Normal breath sounds. No wheezing or rales.  Abdominal:     Palpations: Abdomen is soft.     Tenderness: There is no abdominal tenderness.  Musculoskeletal:     Cervical back: Neck supple. No tenderness.     Right lower leg: No edema.     Left lower leg: No edema.  Skin:    General: Skin is warm.     Findings: No rash.  Neurological:     General: No focal deficit present.     Mental Status: He is alert and oriented to person, place, and time.     Cranial Nerves: No cranial nerve deficit.     Sensory: No sensory deficit.     Motor: No weakness.  Psychiatric:        Mood and Affect: Mood normal.        Behavior: Behavior normal.    BP 112/78 (BP Location: Right Arm, Patient Position: Sitting, Cuff Size: Normal)   Pulse 79   Resp 18   Ht '5\' 5"'  (1.651 m)   Wt 151 lb 6.4 oz (68.7 kg)   SpO2 99%   BMI 25.19 kg/m  Wt Readings from Last 3 Encounters:  09/10/21 151 lb 6.4 oz (68.7 kg)  08/14/21 150 lb (68 kg)  07/23/21 150 lb (68 kg)    Lab Results   Component Value Date   TSH 0.261 (L) 05/28/2021   Lab Results  Component Value Date   WBC 7.6 07/05/2021   HGB 13.6 07/05/2021   HCT 41.2 07/05/2021   MCV 82.6 07/05/2021   PLT 365 07/05/2021   Lab Results  Component Value  Date   NA 137 07/05/2021   K 3.7 07/05/2021   CO2 29 07/05/2021   GLUCOSE 94 07/05/2021   BUN 15 07/05/2021   CREATININE 1.06 07/05/2021   BILITOT 0.8 07/05/2021   ALKPHOS 91 07/05/2021   AST 23 07/05/2021   ALT 41 07/05/2021   PROT 8.7 (H) 07/05/2021   ALBUMIN 4.5 07/05/2021   CALCIUM 10.4 (H) 07/05/2021   ANIONGAP 10 07/05/2021   EGFR 87 05/28/2021   Lab Results  Component Value Date   CHOL 165 05/28/2021   Lab Results  Component Value Date   HDL 50 05/28/2021   Lab Results  Component Value Date   LDLCALC 101 (H) 05/28/2021   Lab Results  Component Value Date   TRIG 71 05/28/2021   Lab Results  Component Value Date   CHOLHDL 3.3 05/28/2021   Lab Results  Component Value Date   HGBA1C 5.8 (H) 05/28/2021      Assessment & Plan:   Problem List Items Addressed This Visit       Other   Tobacco use disorder    Has been trying cut down - states he smokes about 6-7 cigarette/day  Asked about quitting: confirms that he currently smokes cigarettes Advise to quit smoking: Educated about QUITTING to reduce the risk of cancer, cardio and cerebrovascular disease. Assess willingness: Unwilling to quit at this time, but is working on cutting back. Assist with counseling and pharmacotherapy: Counseled for 5 minutes and literature provided. Arrange for follow up: follow up in 3 months and continue to offer help.      Encounter for general adult medical examination with abnormal findings - Primary    Physical exam as documented. Fasting blood tests ordered.       Relevant Orders   TSH   Lipid panel   Hemoglobin A1c   CMP14+EGFR   CBC with Differential/Platelet   Other Visit Diagnoses     Vitamin D deficiency       Relevant  Orders   VITAMIN D 25 Hydroxy (Vit-D Deficiency, Fractures)       No orders of the defined types were placed in this encounter.   Follow-up: Return in about 1 year (around 09/11/2022) for Annual physical.    Lindell Spar, MD

## 2021-09-10 NOTE — Assessment & Plan Note (Signed)
Has been trying cut down - states he smokes about 6-7 cigarette/day  Asked about quitting: confirms that he currently smokes cigarettes Advise to quit smoking: Educated about QUITTING to reduce the risk of cancer, cardio and cerebrovascular disease. Assess willingness: Unwilling to quit at this time, but is working on cutting back. Assist with counseling and pharmacotherapy: Counseled for 5 minutes and literature provided. Arrange for follow up: follow up in 3 months and continue to offer help.

## 2021-09-23 ENCOUNTER — Other Ambulatory Visit: Payer: Self-pay | Admitting: Internal Medicine

## 2021-09-23 NOTE — Telephone Encounter (Signed)
Appointment on 6/8

## 2021-09-26 ENCOUNTER — Other Ambulatory Visit (HOSPITAL_COMMUNITY): Payer: Self-pay

## 2021-09-26 ENCOUNTER — Other Ambulatory Visit: Payer: Self-pay

## 2021-09-26 ENCOUNTER — Ambulatory Visit (INDEPENDENT_AMBULATORY_CARE_PROVIDER_SITE_OTHER): Payer: Self-pay | Admitting: Internal Medicine

## 2021-09-26 ENCOUNTER — Telehealth: Payer: Self-pay

## 2021-09-26 ENCOUNTER — Encounter: Payer: Self-pay | Admitting: Internal Medicine

## 2021-09-26 VITALS — BP 120/75 | HR 74 | Temp 97.8°F | Wt 150.0 lb

## 2021-09-26 DIAGNOSIS — Z79899 Other long term (current) drug therapy: Secondary | ICD-10-CM

## 2021-09-26 DIAGNOSIS — Z7252 High risk homosexual behavior: Secondary | ICD-10-CM

## 2021-09-26 DIAGNOSIS — Z113 Encounter for screening for infections with a predominantly sexual mode of transmission: Secondary | ICD-10-CM

## 2021-09-26 MED ORDER — DOXYCYCLINE HYCLATE 100 MG PO TABS: 200.0000 mg | ORAL_TABLET | Freq: Once | ORAL | 3 refills | Status: AC

## 2021-09-26 MED ORDER — EMTRICITABINE-TENOFOVIR DF 200-300 MG PO TABS: 1.0000 | ORAL_TABLET | Freq: Every day | ORAL | 11 refills | Status: DC

## 2021-09-26 NOTE — Progress Notes (Signed)
[facility]  Reason for Consult:syphilis Referring Provider: Ihor Dow    Patient Active Problem List   Diagnosis Date Noted   Encounter for general adult medical examination with abnormal findings 09/10/2021   Encounter for examination following treatment at hospital 07/09/2021   Syphilis 07/09/2021   LAD (lymphadenopathy), inguinal 07/09/2021   Gastroesophageal reflux disease without esophagitis 05/28/2021   Hemorrhoids 08/01/2020   Depression, major, single episode, moderate ([place]) 08/01/2020   Intertrigo 08/01/2020   Tobacco use disorder 08/03/2013      HPI: Mohamed Cortner Gaccione is a 31 y.o. male referred here from his pcp for syphilis   I reviewed chart and labs He has a nonreactive rprin 05/28/21 and 07/2020 He then had a reactive rpr with titer 1:16 in 06/2021 and 07/23/2021 with positive fta as well His other relevant labs: Hep b sAb positive in 05/28/2021; hep b sAg negative in 07/2020 Hep c ab negative in 07/2020 Hiv nonreactive in 06/2021  He practiced receptive oral/anal intercourse. Msm  When he was positive 06/2021 he got one penicillin shot sometimes 3rd week of the month He retested in 07/2021 as he developed a rash on face/trunk/upper extremities but not palm/sole. He had another benzathine pcn shot at urgent care of [place] during 08/04/21 visit. The rash is receding. He admits to having unprotected sex after march 2023   He denies eye pain/redness/visual change/hearing change. No penile discharge/rectal discharge or pain  He had left groin lump nontender that is improving  He is interested in prep (never takes -- doesn't like needle) and doxycycline pep  No hx of substance use   -------------- 09/26/2021 [facility] clinic f/u Lost job recently no insurance for PrEp No concern otherwise including s/s of std No f/c   Review of Systems: ROS Other ros negative      Past Medical History:  Diagnosis Date   Encounter  for hepatitis C screening test for low risk patient 06/28/2019    Social History   Tobacco Use   Smoking status: Every Day    Packs/day: 0.30    Years: 6.00    Total pack years: 1.80    Types: Cigarettes   Smokeless tobacco: Never   Tobacco comments:    States working on quitting  Vaping Use   Vaping Use: Never used  Substance Use Topics   Alcohol use: Yes    Comment: occasional   Drug use: Yes    Frequency: 7.0 times per week    Types: Marijuana    Comment: occasional    Family History  Problem Relation Age of Onset   Hypertension Mother    Learning disabilities Maternal Grandmother    Stroke Maternal Grandfather     No Known Allergies  OBJECTIVE: Vitals:   09/26/21 1103  BP: 120/75  Pulse: 74  Temp: 97.8 F (36.6 C)  TempSrc: Oral  Weight: 150 lb (68 kg)   Body mass index is 24.96 kg/m.   Physical Exam General/constitutional: no distress, pleasant HEENT: Normocephalic, PER, Conj Clear, EOMI, Oropharynx clear Neck supple CV: rrr no mrg Lungs: clear to auscultation, normal respiratory effort Abd: Soft, Nontender Ext: no edema Skin: No Rash Neuro: nonfocal MSK: no peripheral joint swelling/tenderness/warmth; back spines nontender       Lab: Lab Results  Component Value Date   WBC 7.6 07/05/2021   HGB 13.6 07/05/2021   HCT 41.2 07/05/2021   MCV 82.6 07/05/2021   PLT 365 07/05/2021  Last metabolic panel Lab Results  Component Value Date   GLUCOSE 94 07/05/2021   NA 137 07/05/2021   K 3.7 07/05/2021   CL 98 07/05/2021   CO2 29 07/05/2021   BUN 15 07/05/2021   CREATININE 1.06 07/05/2021   GFRNONAA >60 07/05/2021   CALCIUM 10.4 (H) 07/05/2021   PROT 8.7 (H) 07/05/2021   ALBUMIN 4.5 07/05/2021   LABGLOB 2.7 05/28/2021   AGRATIO 1.6 05/28/2021   BILITOT 0.8 07/05/2021   ALKPHOS 91 07/05/2021   AST 23 07/05/2021   ALT 41 07/05/2021   ANIONGAP 10 07/05/2021    Microbiology:  Serology:  Imaging:   Assessment/plan: Problem  List Items Addressed This Visit   None Visit Diagnoses     On pre-exposure prophylaxis for HIV    -  Primary   High risk homosexual behavior       Relevant Orders   Hepatitis C antibody   Hepatitis B surface antibody,quantitative   Hepatitis B Surface AntiGEN   Hepatitis B Core Antibody, total   Screening for STDs (sexually transmitted diseases)       Relevant Orders   HIV 1 RNA quant-no reflex-bld   RPR   Hepatitis C antibody   Hepatitis B surface antibody,quantitative   Hepatitis B Surface AntiGEN   Hepatitis B Core Antibody, total   Urine cytology ancillary only([place])   Cytology (oral, anal, urethral) ancillary only   Cytology (oral, anal, urethral) ancillary only        Patient had syphilis in 06/2021 primary (assymptomatic) then reexposed in 08/04/2021 with rash and treated. No sign/sx of disseminated syphilis to cns/eye.  He has left inguinal node which is nontender without penile/gu ulceration. Suspect related to syphilis, but will need triple screen for gc/chlamydia  He is interested in PEP and PrEP. He never took prep before  I also discussed if he could help spread the word about prep to his friends and we as a clinic could provide for those as needed   -triple screen gc/chlamydia -retest hepatitis  panel; if not immunized against B will vaccinate -PreP descovy rx and PEP doxy given -avoid sex for the next 2 weeks while we are screening for all std's   ------------ 6/8 assessment High risk sexual behavior no condoms Hx syphilis  -advise condom use  -continue pep/prep -will discuss with pharmacy team to see if assistance available for meds as he lost insurance -labs today -f/u 3 months  -repeat rpr  Follow-up: No follow-ups on file.  I have spent a total of 65 minutes of face-to-face and non-face-to-face time, excluding clinical staff time, preparing to see patient, ordering tests and/or medications, and provide counseling the patient    Jabier Mutton, [place] [facility] [phone] pager   [phone] cell 09/26/2021, 11:25 AM

## 2021-09-26 NOTE — Patient Instructions (Signed)
Make sure you use condom regularly  Labs today  See [place] in 3 months

## 2021-09-26 NOTE — Telephone Encounter (Signed)
[facility] Patient Advocate Encounter  Completed and sent [employer] Advancing Access application for Truvada for this patient who is uninsured.    Patient assistance phone number for follow up is [phone].   This encounter will be updated until final determination.,   Laurrie Toppin, CPhT Specialty Pharmacy Patient [facility] Phone: [phone] Fax:  [phone]  

## 2021-09-27 ENCOUNTER — Telehealth: Payer: Self-pay

## 2021-09-27 ENCOUNTER — Other Ambulatory Visit (HOSPITAL_COMMUNITY): Payer: Self-pay

## 2021-09-27 MED ORDER — EMTRICITABINE-TENOFOVIR DF 200-300 MG PO TABS
1.0000 | ORAL_TABLET | Freq: Every day | ORAL | 0 refills | Status: DC
  Filled 2021-09-27: qty 30, 30d supply, fill #0

## 2021-09-27 NOTE — Telephone Encounter (Signed)
[facility] Patient Advocate Encounter  Completed and sent [employer] Advancing Access application for Truvada for this patient who is uninsured.    Patient is approved 09/27/21 through 09/27/22.  BIN      [id number] PCN    [id number] [id number]    [id number] ID        [id number]   Clearance Coots, CPhT Specialty Pharmacy Patient [facility] Phone: [phone] Fax:  [phone]

## 2021-09-29 LAB — HEPATITIS B SURFACE ANTIBODY, QUANTITATIVE: Hep B S AB Quant (Post): 11 m[IU]/mL (ref >=10)

## 2021-09-29 LAB — HIV-1 RNA QUANT-NO REFLEX-BLD
HIV 1 RNA Quant: NOT DETECTED Copies/mL
HIV-1 RNA Quant, Log: NOT DETECTED Log cps/mL

## 2021-09-29 LAB — HEPATITIS C ANTIBODY
Hepatitis C Ab: NONREACTIVE
SIGNAL TO CUT-OFF: 0.34 (ref <1.00)

## 2021-09-29 LAB — HEPATITIS B CORE ANTIBODY, TOTAL: Hep B Core Total Ab: NONREACTIVE

## 2021-09-29 LAB — RPR: RPR Ser Ql: NONREACTIVE

## 2021-09-29 LAB — HEPATITIS B SURFACE ANTIGEN: Hepatitis B Surface Ag: NONREACTIVE

## 2021-09-30 ENCOUNTER — Other Ambulatory Visit (HOSPITAL_COMMUNITY): Payer: Self-pay

## 2021-09-30 LAB — CYTOLOGY, (ORAL, ANAL, URETHRAL) ANCILLARY ONLY
Chlamydia: NEGATIVE
Chlamydia: NEGATIVE
Comment: NEGATIVE
Comment: NEGATIVE
Comment: NORMAL
Comment: NORMAL
Neisseria Gonorrhea: NEGATIVE
Neisseria Gonorrhea: NEGATIVE

## 2021-09-30 LAB — URINE CYTOLOGY ANCILLARY ONLY
Chlamydia: NEGATIVE
Comment: NEGATIVE
Comment: NEGATIVE
Comment: NORMAL
Neisseria Gonorrhea: NEGATIVE
Trichomonas: NEGATIVE

## 2021-10-24 ENCOUNTER — Other Ambulatory Visit: Payer: Self-pay

## 2021-10-24 ENCOUNTER — Other Ambulatory Visit: Payer: Self-pay | Admitting: Pharmacist

## 2021-10-24 ENCOUNTER — Other Ambulatory Visit (HOSPITAL_COMMUNITY): Payer: Self-pay

## 2021-10-24 DIAGNOSIS — Z79899 Other long term (current) drug therapy: Secondary | ICD-10-CM

## 2021-10-24 MED FILL — Emtricitabine-Tenofovir Disoproxil Fumarate Tab 200-300 MG: ORAL | 30 days supply | Qty: 30 | Fill #0 | Status: AC

## 2021-10-25 ENCOUNTER — Other Ambulatory Visit (HOSPITAL_COMMUNITY): Payer: Self-pay

## 2021-10-31 ENCOUNTER — Other Ambulatory Visit (HOSPITAL_COMMUNITY): Payer: Self-pay

## 2021-11-21 ENCOUNTER — Other Ambulatory Visit (HOSPITAL_COMMUNITY): Payer: Self-pay

## 2021-11-22 ENCOUNTER — Other Ambulatory Visit (HOSPITAL_COMMUNITY): Payer: Self-pay

## 2021-11-22 MED FILL — Emtricitabine-Tenofovir Disoproxil Fumarate Tab 200-300 MG: ORAL | 30 days supply | Qty: 30 | Fill #1 | Status: AC

## 2021-11-26 ENCOUNTER — Other Ambulatory Visit (HOSPITAL_COMMUNITY): Payer: Self-pay

## 2021-11-30 ENCOUNTER — Encounter (HOSPITAL_COMMUNITY): Payer: Self-pay | Admitting: *Deleted

## 2021-11-30 ENCOUNTER — Emergency Department (HOSPITAL_COMMUNITY)
Admission: EM | Admit: 2021-11-30 | Discharge: 2021-11-30 | Disposition: A | Discharge status: Home / Self Care | Payer: Self-pay | Attending: Emergency Medicine | Admitting: Emergency Medicine

## 2021-11-30 ENCOUNTER — Other Ambulatory Visit: Payer: Self-pay

## 2021-11-30 DIAGNOSIS — K0889 Other specified disorders of teeth and supporting structures: Secondary | ICD-10-CM | POA: Insufficient documentation

## 2021-11-30 DIAGNOSIS — R519 Headache, unspecified: Secondary | ICD-10-CM | POA: Insufficient documentation

## 2021-11-30 MED ORDER — PENICILLIN V POTASSIUM 500 MG PO TABS: 500.0000 mg | ORAL_TABLET | Freq: Three times a day (TID) | ORAL | 0 refills | Status: DC

## 2021-11-30 MED ORDER — IBUPROFEN 800 MG PO TABS: 800.0000 mg | ORAL_TABLET | Freq: Three times a day (TID) | ORAL | 0 refills | Status: DC

## 2021-11-30 NOTE — ED Provider Notes (Signed)
[facility] EMERGENCY DEPARTMENT Provider Note   CSN: [id number] Arrival date & time: 11/30/21  1116     History  Chief Complaint  Patient presents with   Dental Pain    Mario Nguyen is a 31 y.o. male.   Dental Pain Associated symptoms: headaches   Associated symptoms: no congestion, no facial swelling, no fever and no neck pain        Mario Nguyen is a 31 y.o. male who presents to the Emergency Department complaining of left upper dental pain and headache since last night.  He describes a throbbing pain to his left upper teeth, worse with chewing.  Pain was radiating into his left temple area last evening.  Pain unimproved after taking Tylenol.  Denies any difficulty swallowing, fever, neck stiffness, dizziness and facial swelling.  Does not currently have a dentist.  Home Medications Prior to Admission medications   Medication Sig Start Date End Date Taking? Authorizing Provider  emtricitabine-tenofovir (TRUVADA) 200-300 MG tablet Take 1 tablet daily 10/24/21   Vu, Tonita Phoenix, MD      Allergies    Patient has no known allergies.    Review of Systems   Review of Systems  Constitutional:  Negative for chills and fever.  HENT:  Positive for dental problem. Negative for congestion, ear pain, facial swelling, rhinorrhea, sore throat and trouble swallowing.   Eyes:  Negative for visual disturbance.  Respiratory:  Negative for shortness of breath.   Cardiovascular:  Negative for chest pain.  Gastrointestinal:  Negative for abdominal pain, diarrhea, nausea and vomiting.  Musculoskeletal:  Negative for neck pain and neck stiffness.  Neurological:  Positive for headaches. Negative for dizziness, seizures, syncope, weakness and numbness.    Physical Exam Updated Vital Signs BP 132/77 (BP Location: Left Arm)   Pulse 72   Temp 97.9 F (36.6 C) (Oral)   Resp 16   Ht 5\' 5"  (1.651 m)   Wt 68 kg   SpO2 99%   BMI 24.96 kg/m  Physical Exam Vitals and nursing  note reviewed.  Constitutional:      General: He is not in acute distress.    Appearance: Normal appearance. He is not toxic-appearing.  HENT:     Right Ear: Tympanic membrane and ear canal normal.     Left Ear: Tympanic membrane and ear canal normal.     Ears:     Comments: Significant cerumen of the left ear canal, TM still visualized, no bulging or erythema.    Mouth/Throat:     Mouth: Mucous membranes are moist.     Dentition: Dental tenderness present. No gingival swelling or dental abscesses.     Pharynx: Oropharynx is clear. Uvula midline. No pharyngeal swelling, posterior oropharyngeal erythema or uvula swelling.     Comments: Tenderness palpation along the left upper second and third molars.  No erythema or edema of the surrounding gingiva.  No obvious dental caries.  No malocclusion or tenderness of the TMJ.  No trismus. Cardiovascular:     Rate and Rhythm: Normal rate and regular rhythm.     Pulses: Normal pulses.  Pulmonary:     Effort: Pulmonary effort is normal.     Breath sounds: Normal breath sounds.  Abdominal:     Palpations: Abdomen is soft.     Tenderness: There is no abdominal tenderness.  Musculoskeletal:        General: Normal range of motion.     Cervical back: Normal range of motion. No  rigidity or tenderness.  Lymphadenopathy:     Cervical: No cervical adenopathy.  Skin:    General: Skin is warm.     Capillary Refill: Capillary refill takes less than 2 seconds.     Findings: No rash.  Neurological:     General: No focal deficit present.     Mental Status: He is alert.     Sensory: No sensory deficit.     Motor: No weakness.     ED Results / Procedures / Treatments   Labs (all labs ordered are listed, but only abnormal results are displayed) Labs Reviewed - No data to display  EKG None  Radiology No results found.  Procedures Procedures    Medications Ordered in ED Medications - No data to display  ED Course/ Medical Decision Making/  A&P                           Medical Decision Making Patient here for eval of left-sided headache and dental pain since yesterday.  No fever neck pain or stiffness.  No visual changes or dizziness.  No known fever.  Headache gradual in onset,    On exam, patient well-appearing nontoxic vital signs are reassuring.  No meningeal signs.  He has some tenderness palpation of his left upper molars without obvious dental caries.  Headache likely associated with dental pain.  No concerning symptoms for meningitis or Ludewig's angina.  I feel that he is appropriate for discharge home, will treat with antibiotics and NSAID.  He will be given resource list for local dentistry.  Return precautions discussed.           Final Clinical Impression(s) / ED Diagnoses Final diagnoses:  Pain, dental  Nonintractable episodic headache, unspecified headache type    Rx / DC Orders ED Discharge Orders     None         Rosey Bath 11/30/21 1207    Maia Plan, MD 12/01/21 1815

## 2021-11-30 NOTE — ED Triage Notes (Signed)
Pt with left sided upper dental pain since last night.  Pt with left sided HA since last night as well. Pt believes he may have some cavities.  Denies any fever or N/V.

## 2021-11-30 NOTE — Discharge Instructions (Signed)
Take the antibiotic as directed.  Ibuprofen for pain and headache.  Take the ibuprofen with food.  Makes a small amount of hydrogen peroxide and water to rinse your mouth 1-2 times daily.  Contact one of the dentists listed to arrange a follow-up appointment.  Return to the emergency department for any new or worsening symptoms.

## 2021-12-20 ENCOUNTER — Other Ambulatory Visit (HOSPITAL_COMMUNITY): Payer: Self-pay

## 2021-12-20 MED FILL — Emtricitabine-Tenofovir Disoproxil Fumarate Tab 200-300 MG: ORAL | 30 days supply | Qty: 30 | Fill #2 | Status: CN

## 2021-12-26 ENCOUNTER — Ambulatory Visit: Payer: Self-pay | Admitting: Internal Medicine

## 2021-12-27 ENCOUNTER — Ambulatory Visit: Payer: Self-pay | Admitting: Internal Medicine

## 2022-01-01 ENCOUNTER — Ambulatory Visit (INDEPENDENT_AMBULATORY_CARE_PROVIDER_SITE_OTHER): Payer: Self-pay | Admitting: Internal Medicine

## 2022-01-01 ENCOUNTER — Encounter: Payer: Self-pay | Admitting: Internal Medicine

## 2022-01-01 ENCOUNTER — Other Ambulatory Visit: Payer: Self-pay

## 2022-01-01 VITALS — BP 124/79 | HR 70 | Temp 98.0°F | Ht 65.0 in | Wt 152.0 lb

## 2022-01-01 DIAGNOSIS — Z113 Encounter for screening for infections with a predominantly sexual mode of transmission: Secondary | ICD-10-CM

## 2022-01-01 DIAGNOSIS — Z7251 High risk heterosexual behavior: Secondary | ICD-10-CM

## 2022-01-01 MED ORDER — DOXYCYCLINE HYCLATE 100 MG PO TABS: 100.0000 mg | ORAL_TABLET | Freq: Two times a day (BID) | ORAL | 0 refills | Status: AC

## 2022-01-01 NOTE — Addendum Note (Signed)
Addended by: Harley Alto on: 01/01/2022 04:40 PM   Modules accepted: Orders

## 2022-01-01 NOTE — Patient Instructions (Addendum)
Check labs today    Pickup doxycycline antibiotics -take 2 tablet (200 mg) within 72 hour if you have unexpected condom break. This will reduce risk syphilis, gonorrhea/chlamydia   Continue truvada one tablet once a day to prevent hiv infection   Make sure you use condom regardless

## 2022-01-01 NOTE — Progress Notes (Signed)
[facility]  Reason for Consult:prep follow up    Patient Active Problem List   Diagnosis Date Noted   Encounter for general adult medical examination with abnormal findings 09/10/2021   Encounter for examination following treatment at hospital 07/09/2021   Syphilis 07/09/2021   LAD (lymphadenopathy), inguinal 07/09/2021   Gastroesophageal reflux disease without esophagitis 05/28/2021   Hemorrhoids 08/01/2020   Depression, major, single episode, moderate ([facility]) 08/01/2020   Intertrigo 08/01/2020   Tobacco use disorder 08/03/2013      HPI: Mario Nguyen is a 31 y.o. male referred here from his pcp for syphilis previously, now here for ongoing PrEP / PEP care   I reviewed chart and labs He has a nonreactive rprin 05/28/21 and 07/2020 He then had a reactive rpr with titer 1:16 in 06/2021 and 07/23/2021 with positive fta as well His other relevant labs: Hep b sAb positive in 05/28/2021; hep b sAg negative in 07/2020 Hep c ab negative in 07/2020 Hiv nonreactive in 06/2021  He practiced receptive oral/anal intercourse. Msm  When he was positive 06/2021 he got one penicillin shot sometimes 3rd week of the month He retested in 07/2021 as he developed a rash on face/trunk/upper extremities but not palm/sole. He had another benzathine pcn shot at urgent care of [place] during 08/04/21 visit. The rash is receding. He admits to having unprotected sex after march 2023   He denies eye pain/redness/visual change/hearing change. No penile discharge/rectal discharge or pain  He had left groin lump nontender that is improving  He is interested in prep (never takes -- doesn't like needle) and doxycycline pep  No hx of substance use   -------------- 09/26/2021 id clinic f/u Lost job recently no insurance for PrEp No concern otherwise including s/s of std No f/c   01/01/22 id clinic f/u Taking truvada No sx in terms of visual change, rash, penile  discharge, sorethroat, rectal pain/discharge Hasn't been using doxy or remember having picked it up   Review of Systems: ROS Other ros negative      Past Medical History:  Diagnosis Date   Encounter for hepatitis C screening test for low risk patient 06/28/2019    Social History   Tobacco Use   Smoking status: Every Day    Packs/day: 0.30    Years: 6.00    Total pack years: 1.80    Types: Cigarettes   Smokeless tobacco: Never   Tobacco comments:    States working on quitting  Vaping Use   Vaping Use: Never used  Substance Use Topics   Alcohol use: Yes    Comment: occasional   Drug use: Yes    Frequency: 7.0 times per week    Types: Marijuana    Comment: occasional    Family History  Problem Relation Age of Onset   Hypertension Mother    Learning disabilities Maternal Grandmother    Stroke Maternal Grandfather     No Known Allergies  OBJECTIVE: Vitals:   01/01/22 1609  BP: 124/79  Pulse: 70  Temp: 98 F (36.7 C)  TempSrc: Oral  Weight: 152 lb (68.9 kg)  Height: 5\' 5"  (1.651 m)   Body mass index is 25.29 kg/m.   Physical Exam General/constitutional: no distress, pleasant HEENT: Normocephalic, PER, Conj Clear, EOMI, Oropharynx clear Neck supple CV: rrr no mrg Lungs: clear to auscultation, normal respiratory effort Abd: Soft, Nontender Ext: no edema Skin: No Rash Neuro: nonfocal MSK: no peripheral  joint swelling/tenderness/warmth; back spines nontender        Lab: Lab Results  Component Value Date   WBC 7.6 07/05/2021   HGB 13.6 07/05/2021   HCT 41.2 07/05/2021   MCV 82.6 07/05/2021   PLT 365 07/05/2021   Last metabolic panel Lab Results  Component Value Date   GLUCOSE 94 07/05/2021   NA 137 07/05/2021   K 3.7 07/05/2021   CL 98 07/05/2021   CO2 29 07/05/2021   BUN 15 07/05/2021   CREATININE 1.06 07/05/2021   GFRNONAA >60 07/05/2021   CALCIUM 10.4 (H) 07/05/2021   PROT 8.7 (H) 07/05/2021   ALBUMIN 4.5 07/05/2021    LABGLOB 2.7 05/28/2021   AGRATIO 1.6 05/28/2021   BILITOT 0.8 07/05/2021   ALKPHOS 91 07/05/2021   AST 23 07/05/2021   ALT 41 07/05/2021   ANIONGAP 10 07/05/2021    Microbiology:  Serology:  Imaging:   Assessment/plan: Problem List Items Addressed This Visit   None Visit Diagnoses     High risk sexual behavior, unspecified type    -  Primary   Relevant Orders   Basic metabolic panel   RPR   CBC   Cytology (oral, anal, urethral) ancillary only   Cytology (oral, anal, urethral) ancillary only   Cytology (oral, anal, urethral) ancillary only   Screening for STDs (sexually transmitted diseases)       Relevant Orders   Basic metabolic panel   RPR   CBC   Cytology (oral, anal, urethral) ancillary only   Cytology (oral, anal, urethral) ancillary only   Cytology (oral, anal, urethral) ancillary only        Patient had syphilis in 06/2021 primary (assymptomatic) then reexposed in 08/04/2021 with rash and treated. No sign/sx of disseminated syphilis to cns/eye.  He has left inguinal node which is nontender without penile/gu ulceration. Suspect related to syphilis, but will need triple screen for gc/chlamydia  He is interested in PEP and PrEP. He never took prep before  I also discussed if he could help spread the word about prep to his friends and we as a clinic could provide for those as needed   -triple screen gc/chlamydia -retest hepatitis  panel; if not immunized against B will vaccinate -PreP descovy rx and PEP doxy given -avoid sex for the next 2 weeks while we are screening for all std's   ------------ 9/13 assessment Advise benefit of PEP... doxy within 1-72 hours exposure that is unexpected Encourage condom use Needing rpr titer rechecked post treatment  On truvada  Labs today  F/u 3 months   Follow-up: No follow-ups on file.  I have spent a total of 20 minutes of face-to-face and non-face-to-face time, excluding clinical staff time, preparing to  see patient, ordering tests and/or medications, and provide counseling the patient    Raymondo Band, MD [facility] [phone] pager   [phone] cell 01/01/2022, 4:20 PM

## 2022-01-02 LAB — CBC
HCT: 40.6 % (ref 38.5–50.0)
Hemoglobin: 13.8 g/dL (ref 13.2–17.1)
MCH: 28.2 pg (ref 27.0–33.0)
MCHC: 34 g/dL (ref 32.0–36.0)
MCV: 82.9 fL (ref 80.0–100.0)
MPV: 8.8 fL (ref 7.5–12.5)
Platelets: 309 10*3/uL (ref 140–400)
RBC: 4.9 10*6/uL (ref 4.20–5.80)
RDW: 14 % (ref 11.0–15.0)
WBC: 5.5 10*3/uL (ref 3.8–10.8)

## 2022-01-02 LAB — BASIC METABOLIC PANEL
BUN: 11 mg/dL (ref 7–25)
CO2: 28 mmol/L (ref 20–32)
Calcium: 10.1 mg/dL (ref 8.6–10.3)
Chloride: 102 mmol/L (ref 98–110)
Creat: 1.12 mg/dL (ref 0.60–1.26)
Glucose, Bld: 89 mg/dL (ref 65–99)
Potassium: 4.2 mmol/L (ref 3.5–5.3)
Sodium: 139 mmol/L (ref 135–146)

## 2022-01-02 LAB — RPR: RPR Ser Ql: NONREACTIVE

## 2022-01-03 LAB — CYTOLOGY, (ORAL, ANAL, URETHRAL) ANCILLARY ONLY
Chlamydia: NEGATIVE
Chlamydia: NEGATIVE
Comment: NEGATIVE
Comment: NEGATIVE
Comment: NORMAL
Comment: NORMAL
Neisseria Gonorrhea: NEGATIVE
Neisseria Gonorrhea: NEGATIVE

## 2022-01-03 LAB — URINE CYTOLOGY ANCILLARY ONLY
Chlamydia: NEGATIVE
Comment: NEGATIVE
Comment: NORMAL
Neisseria Gonorrhea: NEGATIVE

## 2022-01-06 ENCOUNTER — Other Ambulatory Visit: Payer: Self-pay

## 2022-01-06 ENCOUNTER — Telehealth: Payer: Self-pay

## 2022-01-06 DIAGNOSIS — Z7251 High risk heterosexual behavior: Secondary | ICD-10-CM

## 2022-01-06 NOTE — Telephone Encounter (Signed)
-----   Message from Caffie Pinto sent at 01/06/2022  9:28 AM EDT ----- Regarding: RE: Add VL Unfortunately, we can not add on the Viral Load, it requires a tube that was not collected. ----- Message ----- From: Eugenia Mcalpine, LPN Sent: 06/02/9415   9:05 AM EDT To: Sharlynn Oliphant Price Subject: Add VL                                         Is it too late to add HIV RNA? ----- Message ----- From: Jabier Mutton, MD Sent: 01/03/2022  12:49 PM EDT To: [facility] Clinical Pool  Hi team I'm not sure if I forgot to order the HIV RNA, but it's not on the result   Could we order a viral load for him for the HIV prep monitoring and let him know to come and get it done  I will also let him know beforehand  Thank you

## 2022-01-06 NOTE — Telephone Encounter (Signed)
-----   Message from Kristy M Price sent at 01/06/2022  9:28 AM EDT ----- Regarding: RE: Add VL Unfortunately, we can not add on the Viral Load, it requires a tube that was not collected. ----- Message ----- From: Woody Kronberg, LPN Sent: 01/06/2022   9:05 AM EDT To: Kristy M Price Subject: Add VL                                         Is it too late to add HIV RNA? ----- Message ----- From: Vu, Trung T, MD Sent: 01/03/2022  12:49 PM EDT To: [facility] Clinical Pool  Hi team I'm not sure if I forgot to order the HIV RNA, but it's not on the result   Could we order a viral load for him for the HIV prep monitoring and let him know to come and get it done  I will also let him know beforehand  Thank you   

## 2022-01-06 NOTE — Addendum Note (Signed)
Addended by: Caffie Pinto on: 01/06/2022 02:48 PM   Modules accepted: Orders

## 2022-01-06 NOTE — Telephone Encounter (Signed)
Attempted to contact patient to schedule lab appointment for HIV RNA. Left patient a HIPPA compliant voicemail to return [facility] call.  Will also send patient a Mychart message as well. Mario Nguyen

## 2022-01-06 NOTE — Telephone Encounter (Signed)
Patient returned phone call. Patient scheduled for lab appointment today

## 2022-01-12 LAB — HIV-1 RNA QUANT-NO REFLEX-BLD
HIV 1 RNA Quant: NOT DETECTED Copies/mL
HIV-1 RNA Quant, Log: NOT DETECTED Log cps/mL

## 2022-01-16 ENCOUNTER — Other Ambulatory Visit (HOSPITAL_COMMUNITY): Payer: Self-pay

## 2022-01-16 MED FILL — Emtricitabine-Tenofovir Disoproxil Fumarate Tab 200-300 MG: ORAL | 30 days supply | Qty: 30 | Fill #2 | Status: AC

## 2022-02-11 ENCOUNTER — Other Ambulatory Visit (HOSPITAL_COMMUNITY): Payer: Self-pay

## 2022-02-11 ENCOUNTER — Other Ambulatory Visit: Payer: Self-pay | Admitting: Pharmacist

## 2022-02-11 DIAGNOSIS — Z79899 Other long term (current) drug therapy: Secondary | ICD-10-CM

## 2022-02-11 MED ORDER — EMTRICITABINE-TENOFOVIR DF 200-300 MG PO TABS
ORAL_TABLET | ORAL | 3 refills | Status: DC
  Filled 2022-02-11: qty 30, fill #0
  Filled 2022-02-25: qty 30, 30d supply, fill #0

## 2022-02-25 ENCOUNTER — Other Ambulatory Visit (HOSPITAL_COMMUNITY): Payer: Self-pay

## 2022-02-26 ENCOUNTER — Other Ambulatory Visit (HOSPITAL_COMMUNITY): Payer: Self-pay

## 2022-02-27 ENCOUNTER — Other Ambulatory Visit (HOSPITAL_COMMUNITY): Payer: Self-pay

## 2022-03-18 ENCOUNTER — Ambulatory Visit
Admission: EM | Admit: 2022-03-18 | Discharge: 2022-03-18 | Disposition: A | Discharge status: Home / Self Care | Payer: Self-pay | Attending: Family Medicine | Admitting: Family Medicine

## 2022-03-18 DIAGNOSIS — K649 Unspecified hemorrhoids: Secondary | ICD-10-CM

## 2022-03-18 DIAGNOSIS — K625 Hemorrhage of anus and rectum: Secondary | ICD-10-CM

## 2022-03-18 MED ORDER — HYDROCORTISONE ACETATE 25 MG RE SUPP: 25.0000 mg | Freq: Two times a day (BID) | RECTAL | 0 refills | Status: DC | PRN

## 2022-03-18 NOTE — ED Provider Notes (Signed)
[facility]    CSN: [id number] Arrival date & time: 03/18/22  1649      History   Chief Complaint Chief Complaint  Patient presents with   Rectal Bleeding    HPI Mario Nguyen is a 31 y.o. male.   Patient presenting today with 2-day history of bright red blood in stools during bowel movements.  Denies any rectal or anal pain, purulent drainage, abdominal pain, fevers.  Does have a history of the same and hemorrhoids.  Not tried anything over-the-counter for symptoms.    Past Medical History:  Diagnosis Date   Encounter for hepatitis C screening test for low risk patient 06/28/2019    Patient Active Problem List   Diagnosis Date Noted   Encounter for general adult medical examination with abnormal findings 09/10/2021   Encounter for examination following treatment at hospital 07/09/2021   Syphilis 07/09/2021   LAD (lymphadenopathy), inguinal 07/09/2021   Gastroesophageal reflux disease without esophagitis 05/28/2021   Hemorrhoids 08/01/2020   Depression, major, single episode, moderate (HCC) 08/01/2020   Intertrigo 08/01/2020   Tobacco use disorder 08/03/2013    History reviewed. No pertinent surgical history.     Home Medications    Prior to Admission medications   Medication Sig Start Date End Date Taking? Authorizing Provider  hydrocortisone (ANUSOL-HC) 25 MG suppository Place 1 suppository (25 mg total) rectally 2 (two) times daily as needed for hemorrhoids or anal itching. 03/18/22  Yes Particia Nearing, PA-C  emtricitabine-tenofovir (TRUVADA) 200-300 MG tablet Take 1 tablet daily 02/11/22   Vu, Gershon Mussel T, MD  ibuprofen (ADVIL) 800 MG tablet Take 1 tablet (800 mg total) by mouth 3 (three) times daily. Take with food 11/30/21   Pauline Aus, PA-C    Family History Family History  Problem Relation Age of Onset   Hypertension Mother    Learning disabilities Maternal Grandmother    Stroke Maternal Grandfather     Social  History Social History   Tobacco Use   Smoking status: Every Day    Packs/day: 0.30    Years: 6.00    Total pack years: 1.80    Types: Cigarettes   Smokeless tobacco: Never   Tobacco comments:    States working on quitting  Vaping Use   Vaping Use: Never used  Substance Use Topics   Alcohol use: Yes    Comment: occasional   Drug use: Yes    Frequency: 7.0 times per week    Types: Marijuana     Allergies   Patient has no known allergies.   Review of Systems Review of Systems Per HPI  Physical Exam Triage Vital Signs ED Triage Vitals  Enc Vitals Group     BP 03/18/22 1735 118/72     Pulse Rate 03/18/22 1735 78     Resp 03/18/22 1735 16     Temp 03/18/22 1735 98 F (36.7 C)     Temp Source 03/18/22 1735 Oral     SpO2 03/18/22 1735 97 %     Weight --      Height --      Head Circumference --      Peak Flow --      Pain Score 03/18/22 1736 0     Pain Loc --      Pain Edu? --      Excl. in [place]? --    No data found.  Updated Vital Signs BP 118/72 (BP Location: Right Arm)   Pulse 78  Temp 98 F (36.7 C) (Oral)   Resp 16   SpO2 97%   Visual Acuity Right Eye Distance:   Left Eye Distance:   Bilateral Distance:    Right Eye Near:   Left Eye Near:    Bilateral Near:     Physical Exam Vitals and nursing note reviewed.  Constitutional:      Appearance: Normal appearance.  HENT:     Head: Atraumatic.  Eyes:     Extraocular Movements: Extraocular movements intact.     Conjunctiva/sclera: Conjunctivae normal.  Cardiovascular:     Rate and Rhythm: Normal rate and regular rhythm.  Pulmonary:     Effort: Pulmonary effort is normal.     Breath sounds: Normal breath sounds.  Abdominal:     General: Bowel sounds are normal. There is no distension.     Palpations: Abdomen is soft.     Tenderness: There is no abdominal tenderness. There is no right CVA tenderness or guarding.  Genitourinary:    Comments: Anorectal exam declined Musculoskeletal:         General: Normal range of motion.     Cervical back: Normal range of motion and neck supple.  Skin:    General: Skin is warm and dry.  Neurological:     General: No focal deficit present.     Mental Status: He is oriented to person, place, and time.  Psychiatric:        Mood and Affect: Mood normal.        Thought Content: Thought content normal.        Judgment: Judgment normal.      UC Treatments / Results  Labs (all labs ordered are listed, but only abnormal results are displayed) Labs Reviewed - No data to display  EKG   Radiology No results found.  Procedures Procedures (including critical care time)  Medications Ordered in UC Medications - No data to display  Initial Impression / Assessment and Plan / UC Course  I have reviewed the triage vital signs and the nursing notes.  Pertinent labs & imaging results that were available during my care of the patient were reviewed by me and considered in my medical decision making (see chart for details).     Vitals and exam benign and reassuring, suspect inflamed internal hemorrhoids.  Discussed good bowel regimen, Anusol suppositories, Tucks wipes and prep H ointment.  GI follow-up if not resolving.  Final Clinical Impressions(s) / UC Diagnoses   Final diagnoses:  Rectal bleeding  Hemorrhoids, unspecified hemorrhoid type   Discharge Instructions   None    ED Prescriptions     Medication Sig Dispense Auth. Provider   hydrocortisone (ANUSOL-HC) 25 MG suppository Place 1 suppository (25 mg total) rectally 2 (two) times daily as needed for hemorrhoids or anal itching. 20 suppository Particia Nearing, [place]      PDMP not reviewed this encounter.   Particia Nearing, [place] 03/18/22 1844

## 2022-03-18 NOTE — ED Triage Notes (Signed)
Pt reports blood I stools x 2 days.

## 2022-03-19 ENCOUNTER — Encounter: Payer: Self-pay | Admitting: Internal Medicine

## 2022-03-24 ENCOUNTER — Other Ambulatory Visit (HOSPITAL_COMMUNITY): Payer: Self-pay

## 2022-03-26 ENCOUNTER — Other Ambulatory Visit (HOSPITAL_COMMUNITY): Payer: Self-pay

## 2022-03-26 ENCOUNTER — Encounter: Payer: Self-pay | Admitting: Pharmacist

## 2022-03-28 ENCOUNTER — Other Ambulatory Visit (HOSPITAL_COMMUNITY): Payer: Self-pay

## 2022-04-02 ENCOUNTER — Other Ambulatory Visit (HOSPITAL_COMMUNITY): Payer: Self-pay

## 2022-04-03 ENCOUNTER — Other Ambulatory Visit (HOSPITAL_COMMUNITY): Payer: Self-pay

## 2022-04-08 ENCOUNTER — Ambulatory Visit: Payer: Self-pay | Admitting: Pharmacist

## 2022-04-08 ENCOUNTER — Ambulatory Visit (INDEPENDENT_AMBULATORY_CARE_PROVIDER_SITE_OTHER): Payer: Self-pay | Admitting: Pharmacist

## 2022-04-08 ENCOUNTER — Other Ambulatory Visit (HOSPITAL_COMMUNITY)
Admission: RE | Admit: 2022-04-08 | Discharge: 2022-04-08 | Disposition: A | Discharge status: Home / Self Care | Payer: Self-pay | Source: Ambulatory Visit | Attending: Internal Medicine | Admitting: Internal Medicine

## 2022-04-08 ENCOUNTER — Other Ambulatory Visit: Payer: Self-pay

## 2022-04-08 ENCOUNTER — Other Ambulatory Visit (HOSPITAL_COMMUNITY): Payer: Self-pay

## 2022-04-08 DIAGNOSIS — Z113 Encounter for screening for infections with a predominantly sexual mode of transmission: Secondary | ICD-10-CM

## 2022-04-08 DIAGNOSIS — Z79899 Other long term (current) drug therapy: Secondary | ICD-10-CM

## 2022-04-08 NOTE — Progress Notes (Unsigned)
Date:  04/08/2022   HPI: Mario Nguyen is a 31 y.o. male who presents to the [facility] pharmacy clinic for HIV PrEP follow-up.  Insured   []    Uninsured  [x]    Patient Active Problem List   Diagnosis Date Noted   Encounter for general adult medical examination with abnormal findings 09/10/2021   Encounter for examination following treatment at hospital 07/09/2021   Syphilis 07/09/2021   LAD (lymphadenopathy), inguinal 07/09/2021   Gastroesophageal reflux disease without esophagitis 05/28/2021   Hemorrhoids 08/01/2020   Depression, major, single episode, moderate (HCC) 08/01/2020   Intertrigo 08/01/2020   Tobacco use disorder 08/03/2013    Patient's Medications  New Prescriptions   No medications on file  Previous Medications   EMTRICITABINE-TENOFOVIR (TRUVADA) 200-300 MG TABLET    Take 1 tablet daily   HYDROCORTISONE (ANUSOL-HC) 25 MG SUPPOSITORY    Place 1 suppository (25 mg total) rectally 2 (two) times daily as needed for hemorrhoids or anal itching.   IBUPROFEN (ADVIL) 800 MG TABLET    Take 1 tablet (800 mg total) by mouth 3 (three) times daily. Take with food  Modified Medications   No medications on file  Discontinued Medications   No medications on file    Allergies: No Known Allergies  Past Medical History: Past Medical History:  Diagnosis Date   Encounter for hepatitis C screening test for low risk patient 06/28/2019    Social History: Social History   Socioeconomic History   Marital status: Single    Spouse name: Not on file   Number of children: Not on file   Years of education: 10   Highest education level: 10th grade  Occupational History   Not on file  Tobacco Use   Smoking status: Every Day    Packs/day: 0.30    Years: 6.00    Total pack years: 1.80    Types: Cigarettes   Smokeless tobacco: Never   Tobacco comments:    States working on quitting  08/05/2013 Use: Never used  Substance and Sexual Activity   Alcohol use: Yes     Comment: occasional   Drug use: Yes    Frequency: 7.0 times per week    Types: Marijuana   Sexual activity: Yes    Partners: Male    Birth control/protection: None    Comment: accepted condoms  Other Topics Concern   Not on file  Social History Narrative   Lives fiance Mario Nguyen 8 years    Dog: [place] Storm y 3 years      Enjoys gardening, listening to music, just relaxing.      Diet: Well balanced, but does like to eat sweets.   Caffeine: Does not drink much tea or sodas, does not drink coffee, most drinks Gatorade.    Water: 3-5 bottles of water daily      Wears seat belt   Does not wear sun protection   Smoke detectors   Occasional use of phone while driving   Social Determinants of Health   Financial Resource Strain: Low Risk  (09/28/2018)   Overall Financial Resource Strain (CARDIA)    Difficulty of Paying Living Expenses: Not hard at all  Food Insecurity: No Food Insecurity (09/28/2018)   Hunger Vital Sign    Worried About Running Out of Food in the Last Year: Never true    Ran Out of Food in the Last Year: Never true  Transportation Needs: No Transportation Needs (09/28/2018)   PRAPARE -  Administrator, Civil Service (Medical): No    Lack of Transportation (Non-Medical): No  Physical Activity: Insufficiently Active (09/28/2018)   Exercise Vital Sign    Days of Exercise per Week: 1 day    Minutes of Exercise per Session: 30 min  Stress: Stress Concern Present (09/28/2018)   [employer] of Occupational Health - Occupational Stress Questionnaire    Feeling of Stress : To some extent  Social Connections: Moderately Isolated (09/28/2018)   Social Connection and Isolation Panel [NHANES]    Frequency of Communication with Friends and Family: Once a week    Frequency of Social Gatherings with Friends and Family: More than three times a week    Attends Religious Services: Never    Database administrator or Organizations: No    Attends Banker Meetings:  Never    Marital Status: Never married        No data to display          Labs:  SCr: Lab Results  Component Value Date   CREATININE 1.12 01/01/2022   CREATININE 1.06 07/05/2021   CREATININE 1.17 05/28/2021   CREATININE 1.02 08/01/2020   CREATININE 1.16 11/04/2016   HIV Lab Results  Component Value Date   HIV NON-REACTIVE 08/14/2021   HIV Non Reactive 07/23/2021   HIV NON REACTIVE 07/05/2021   HIV Non Reactive 05/28/2021   HIV Non Reactive 08/01/2020   Hepatitis B Lab Results  Component Value Date   HEPBSAG NON-REACTIVE 09/26/2021   HEPBCAB NON-REACTIVE 09/26/2021   Hepatitis C Lab Results  Component Value Date   HEPCAB NON-REACTIVE 09/26/2021   Hepatitis A Lab Results  Component Value Date   HAV NON-REACTIVE 08/14/2021   RPR and STI Lab Results  Component Value Date   LABRPR NON-REACTIVE 01/01/2022   LABRPR NON-REACTIVE 09/26/2021   LABRPR Reactive (A) 07/23/2021   LABRPR Reactive (A) 07/05/2021   LABRPR Non Reactive 08/01/2020    STI Results GC GC CT CT  01/01/2022  4:40 PM Negative   Negative    01/01/2022  4:16 PM Negative    Negative   Negative    Negative    09/26/2021 11:32 AM Negative    Negative    Negative   Negative    Negative    Negative    08/14/2021 10:18 AM Negative    Negative    Negative   Negative    Negative    Negative    07/05/2021 12:58 AM Negative   Negative    06/28/2019  3:01 PM Negative   Negative    08/29/2015 10:27 AM  NOT DETECTED   NOT DETECTED     Assessment: Mario Nguyen presents to clinic today for 59m PrEP follow-up on Truvada. He reports no issues with obtaining his medication. He thinks he has missed ~10 doses in the past month. He reports ~2 new sexual partners since the last visit. He denies symptoms consistent with an acute HIV infection. He did state he has a rash but describes it to be around when he shaves and seems to be more consistent with razor burn. He denies symptoms of an STI, but would like STI  screening during today's visit. I counseled him that 10 missed doses/month is sub-optimal for HIV prevention. He states he does not take any other daily medications. I advised him that associating his medication administration with a routine daily activity like brushing his teeth, or drinking his coffee may help improve his adherence. He  voiced understanding.   I discussed Apretude with him during the visit as well, including the dosing schedule, soreness with injection, need to make appointments regularly, and potential for higher likelihood of HIV acquisition during the tail period. He stated he does not like needles, but thinks this option may be better for him than taking a daily medication. He was provided with Donna's card and asked him to send his last 2 months pay stubs to her to verify his income and fill out a patient assistance application for Apretude.   Dr. Renold Don has also prescribed him Doxycycline for post-exposure prophylaxis. He reports taking it appropriately (200 mg with 24-72 hours following an unprotected sexual encounter) without adverse effects. He stated he was able to afford the medication and still has supply. All questions and concerns addressed during the visit.   Plan: - Follow-up HIV Ab  - Follow-up urine, rectal, oral cytologies, RPR  - 74m PrEP follow-up 07/09/22 with Marchelle Folks  - Follow-up Apretude patient assistance application pending income information   Jani Gravel, PharmD PGY-2 Infectious Diseases Resident  04/08/2022 3:30 PM

## 2022-04-09 LAB — CYTOLOGY, (ORAL, ANAL, URETHRAL) ANCILLARY ONLY
Chlamydia: NEGATIVE
Chlamydia: NEGATIVE
Comment: NEGATIVE
Comment: NEGATIVE
Comment: NORMAL
Comment: NORMAL
Neisseria Gonorrhea: NEGATIVE
Neisseria Gonorrhea: NEGATIVE

## 2022-04-09 LAB — URINE CYTOLOGY ANCILLARY ONLY
Chlamydia: NEGATIVE
Comment: NEGATIVE
Comment: NORMAL
Neisseria Gonorrhea: NEGATIVE

## 2022-04-09 LAB — RPR: RPR Ser Ql: NONREACTIVE

## 2022-04-09 LAB — HIV ANTIBODY (ROUTINE TESTING W REFLEX): HIV 1&2 Ab, 4th Generation: NONREACTIVE

## 2022-04-11 ENCOUNTER — Other Ambulatory Visit: Payer: Self-pay | Admitting: Pharmacist

## 2022-04-11 DIAGNOSIS — Z79899 Other long term (current) drug therapy: Secondary | ICD-10-CM

## 2022-04-11 MED ORDER — EMTRICITABINE-TENOFOVIR DF 200-300 MG PO TABS: ORAL_TABLET | ORAL | 2 refills | Status: DC

## 2022-05-01 ENCOUNTER — Ambulatory Visit: Payer: Self-pay | Admitting: Gastroenterology

## 2022-05-02 ENCOUNTER — Other Ambulatory Visit (HOSPITAL_COMMUNITY): Payer: Self-pay

## 2022-07-02 ENCOUNTER — Ambulatory Visit
Admission: EM | Admit: 2022-07-02 | Discharge: 2022-07-02 | Disposition: A | Discharge status: Home / Self Care | Payer: 59 | Attending: Nurse Practitioner | Admitting: Nurse Practitioner

## 2022-07-02 DIAGNOSIS — Z1152 Encounter for screening for COVID-19: Secondary | ICD-10-CM | POA: Insufficient documentation

## 2022-07-02 DIAGNOSIS — R110 Nausea: Secondary | ICD-10-CM | POA: Diagnosis not present

## 2022-07-02 DIAGNOSIS — R5383 Other fatigue: Secondary | ICD-10-CM

## 2022-07-02 LAB — POCT INFLUENZA A/B
Influenza A, POC: NEGATIVE
Influenza B, POC: NEGATIVE

## 2022-07-02 MED ORDER — ONDANSETRON HCL 4 MG PO TABS: 4.0000 mg | ORAL_TABLET | Freq: Four times a day (QID) | ORAL | 0 refills | Status: AC

## 2022-07-02 NOTE — Discharge Instructions (Addendum)
You have been prescribed Zofran '4mg'$  for nausea.   Your Influenza is negative. We encourage conservative treatment with symptom relief. We encourage you to use Tylenol for your pain (Remember to use as directed do not exceed daily dosing recommendations).  Your COVID is pending. Your results will show in your MyChart. Any positive results will result in a phone call from a nurse with next steps in treatment and recommendations.

## 2022-07-02 NOTE — ED Triage Notes (Signed)
Pt reports N/V and feels like he is going to pass out x 1 day. He states he feels "drained".

## 2022-07-02 NOTE — ED Provider Notes (Signed)
[facility]    CSN: [id number] Arrival date & time: 07/02/22  1508      History   Chief Complaint Chief Complaint  Patient presents with   Nausea    HPI Mario Nguyen is a 32 y.o. male.   HPI  He is in today for nausea and generalized fatigue. He reports the symptoms have progressed. He slept most of he day. He is now having dry heaves. He was able to tolerate ginger ale and water. He was tired one the drive over. His current temp is 99.9. He denies any home fever. He was hot last night on his neck and back. He had an episode to his niece unknown illness. He denies any contact with others. Denies headache, dizziness, visual changes, shortness of breath, dyspnea on exertion, chest pain, nausea, vomiting or any edema.   Past Medical History:  Diagnosis Date   Encounter for hepatitis C screening test for low risk patient 06/28/2019    Patient Active Problem List   Diagnosis Date Noted   Encounter for general adult medical examination with abnormal findings 09/10/2021   Encounter for examination following treatment at hospital 07/09/2021   Syphilis 07/09/2021   LAD (lymphadenopathy), inguinal 07/09/2021   Gastroesophageal reflux disease without esophagitis 05/28/2021   Hemorrhoids 08/01/2020   Depression, major, single episode, moderate ([place]) 08/01/2020   Intertrigo 08/01/2020   Tobacco use disorder 08/03/2013    History reviewed. No pertinent surgical history.     Home Medications    Prior to Admission medications   Medication Sig Start Date End Date Taking? Authorizing Provider  ondansetron (ZOFRAN) 4 MG tablet Take 1 tablet (4 mg total) by mouth every 6 (six) hours. 07/02/22  Yes Vevelyn Francois, NP    Family History Family History  Problem Relation Age of Onset   Hypertension Mother    Learning disabilities Maternal Grandmother    Stroke Maternal Grandfather     Social History Social History   Tobacco Use   Smoking status: Every Day     Packs/day: 0.30    Years: 6.00    Additional pack years: 0.00    Total pack years: 1.80    Types: Cigarettes   Smokeless tobacco: Never   Tobacco comments:    States working on quitting  Vaping Use   Vaping Use: Never used  Substance Use Topics   Alcohol use: Yes    Comment: occasional   Drug use: Yes    Frequency: 7.0 times per week    Types: Marijuana     Allergies   Patient has no known allergies.   Review of Systems Review of Systems   Physical Exam Triage Vital Signs ED Triage Vitals  Enc Vitals Group     BP 07/02/22 1513 130/77     Pulse Rate 07/02/22 1513 (!) 106     Resp 07/02/22 1513 20     Temp 07/02/22 1513 99.9 F (37.7 C)     Temp Source 07/02/22 1513 Oral     SpO2 07/02/22 1513 94 %     Weight --      Height --      Head Circumference --      Peak Flow --      Pain Score 07/02/22 1515 0     Pain Loc --      Pain Edu? --      Excl. in [place]? --    No data found.  Updated Vital Signs BP 130/77 (BP  Location: Right Arm)   Pulse (!) 106   Temp 99.9 F (37.7 C) (Oral)   Resp 20   SpO2 94%   Visual Acuity Right Eye Distance:   Left Eye Distance:   Bilateral Distance:    Right Eye Near:   Left Eye Near:    Bilateral Near:     Physical Exam Constitutional:      General: He is not in acute distress.    Appearance: He is normal weight.  HENT:     Head: Normocephalic and atraumatic.  Cardiovascular:     Rate and Rhythm: Normal rate.     Pulses: Normal pulses.  Pulmonary:     Effort: Pulmonary effort is normal.  Abdominal:     General: Abdomen is flat.     Comments: Hypoactive   Musculoskeletal:        General: Normal range of motion.     Cervical back: Normal range of motion.  Skin:    General: Skin is warm.     Capillary Refill: Capillary refill takes less than 2 seconds.  Neurological:     General: No focal deficit present.     Mental Status: He is alert and oriented to person, place, and time.      UC Treatments / Results   Labs (all labs ordered are listed, but only abnormal results are displayed) Labs Reviewed  SARS CORONAVIRUS 2 (TAT 6-24 HRS)  POCT INFLUENZA A/B    EKG   Radiology No results found.  Procedures Procedures (including critical care time)  Medications Ordered in UC Medications - No data to display  Initial Impression / Assessment and Plan / UC Course  I have reviewed the triage vital signs and the nursing notes.  Pertinent labs & imaging results that were available during my care of the patient were reviewed by me and considered in my medical decision making (see chart for details).   nausea Final Clinical Impressions(s) / UC Diagnoses   Final diagnoses:  Fatigue, unspecified type     Discharge Instructions      You have been prescribed Zofran 4mg  for nausea.   Your Influenza is negative. We encourage conservative treatment with symptom relief. We encourage you to use Tylenol for your pain (Remember to use as directed do not exceed daily dosing recommendations).  Your COVID is pending. Your results will show in your MyChart. Any positive results will result in a phone call from a nurse with next steps in treatment and recommendations.      ED Prescriptions     Medication Sig Dispense Auth. Provider   ondansetron (ZOFRAN) 4 MG tablet Take 1 tablet (4 mg total) by mouth every 6 (six) hours. 12 tablet Vevelyn Francois, NP      PDMP not reviewed this encounter.   Dionisio David [place], [place] 07/04/22 [phone]

## 2022-07-03 ENCOUNTER — Other Ambulatory Visit (HOSPITAL_COMMUNITY): Payer: Self-pay

## 2022-07-03 LAB — SARS CORONAVIRUS 2 (TAT 6-24 HRS): SARS Coronavirus 2: NEGATIVE

## 2022-07-04 ENCOUNTER — Other Ambulatory Visit (HOSPITAL_COMMUNITY): Payer: Self-pay

## 2022-07-04 ENCOUNTER — Other Ambulatory Visit: Payer: Self-pay

## 2022-07-04 ENCOUNTER — Telehealth: Payer: Self-pay

## 2022-07-04 ENCOUNTER — Ambulatory Visit (INDEPENDENT_AMBULATORY_CARE_PROVIDER_SITE_OTHER): Payer: 59 | Admitting: Pharmacist

## 2022-07-04 DIAGNOSIS — Z79899 Other long term (current) drug therapy: Secondary | ICD-10-CM

## 2022-07-04 DIAGNOSIS — Z113 Encounter for screening for infections with a predominantly sexual mode of transmission: Secondary | ICD-10-CM | POA: Diagnosis not present

## 2022-07-04 NOTE — Progress Notes (Signed)
Date:  07/04/2022   HPI: Mario Nguyen is a 32 y.o. male who presents to the [place] clinic for HIV PrEP follow-up.  Insured   [x]    Uninsured  []    Patient Active Problem List   Diagnosis Date Noted   Encounter for general adult medical examination with abnormal findings 09/10/2021   Encounter for examination following treatment at hospital 07/09/2021   Syphilis 07/09/2021   LAD (lymphadenopathy), inguinal 07/09/2021   Gastroesophageal reflux disease without esophagitis 05/28/2021   Hemorrhoids 08/01/2020   Depression, major, single episode, moderate ([place]) 08/01/2020   Intertrigo 08/01/2020   Tobacco use disorder 08/03/2013    Patient's Medications  New Prescriptions   No medications on file  Previous Medications   ONDANSETRON (ZOFRAN) 4 MG TABLET    Take 1 tablet (4 mg total) by mouth every 6 (six) hours.  Modified Medications   No medications on file  Discontinued Medications   No medications on file    Allergies: No Known Allergies  Past Medical History: Past Medical History:  Diagnosis Date   Encounter for hepatitis C screening test for low risk patient 06/28/2019    Social History: Social History   Socioeconomic History   Marital status: Single    Spouse name: Not on file   Number of children: Not on file   Nguyen of education: 10   Highest education level: 10th grade  Occupational History   Not on file  Tobacco Use   Smoking status: Every Day    Packs/day: 0.30    Nguyen: 6.00    Additional pack Nguyen: 0.00    Total pack Nguyen: 1.80    Types: Cigarettes   Smokeless tobacco: Never   Tobacco comments:    States working on quitting  Vaping Use   Vaping Use: Never used  Substance and Sexual Activity   Alcohol use: Yes    Comment: occasional   Drug use: Yes    Frequency: 7.0 times per week    Types: Marijuana   Sexual activity: Yes    Partners: Male    Birth control/protection: None    Comment: accepted condoms  Other Topics  Concern   Not on file  Social History Narrative   Lives fiance Mario Nguyen 8 Nguyen    Dog: Mario Nguyen      Enjoys gardening, listening to music, just relaxing.      Diet: Well balanced, but does like to eat sweets.   Caffeine: Does not drink much tea or sodas, does not drink coffee, most drinks Gatorade.    Water: 3-5 bottles of water daily      Wears seat belt   Does not wear sun protection   Smoke detectors   Occasional use of phone while driving   Social Determinants of Health   Financial Resource Strain: Low Risk  (09/28/2018)   Overall Financial Resource Strain (CARDIA)    Difficulty of Paying Living Expenses: Not hard at all  Food Insecurity: No Food Insecurity (09/28/2018)   Hunger Vital Sign    Worried About Running Out of Food in the Last Year: Never true    Ran Out of Food in the Last Year: Never true  Transportation Needs: No Transportation Needs (09/28/2018)   PRAPARE - Hydrologist (Medical): No    Lack of Transportation (Non-Medical): No  Physical Activity: Insufficiently Active (09/28/2018)   Exercise Vital Sign    Days of Exercise per Week: 1 day  Minutes of Exercise per Session: 30 min  Stress: Stress Concern Present (09/28/2018)   [place]    Feeling of Stress : To some extent  Social Connections: Moderately Isolated (09/28/2018)   Social Connection and Isolation Panel [NHANES]    Frequency of Communication with Friends and Family: Once a week    Frequency of Social Gatherings with Friends and Family: More than three times a week    Attends Religious Services: Never    Marine scientist or Organizations: No    Attends Archivist Meetings: Never    Marital Status: Never married       04/08/2022    3:36 PM  CHL HIV PREP FLOWSHEET RESULTS  Insurance Status Uninsured  Gender at birth Male  Gender identity cis-Male  Risk for HIV Hx of STI  Sex  Partners Men only  # sex partners past 3-6 mos 2  Sex activity preferences Insertive and receptive;Oral  Condom use Yes  % condom use 50  Treated for STI? No  PrEP Eligibility Yes    Labs:  SCr: Lab Results  Component Value Date   CREATININE 1.12 01/01/2022   CREATININE 1.06 07/05/2021   CREATININE 1.17 05/28/2021   CREATININE 1.02 08/01/2020   CREATININE 1.16 11/04/2016   HIV Lab Results  Component Value Date   HIV NON-REACTIVE 04/08/2022   HIV NON-REACTIVE 08/14/2021   HIV Non Reactive 07/23/2021   HIV NON REACTIVE 07/05/2021   HIV Non Reactive 05/28/2021   Hepatitis B Lab Results  Component Value Date   HEPBSAG NON-REACTIVE 09/26/2021   HEPBCAB NON-REACTIVE 09/26/2021   Hepatitis C Lab Results  Component Value Date   HEPCAB NON-REACTIVE 09/26/2021   Hepatitis A Lab Results  Component Value Date   HAV NON-REACTIVE 08/14/2021   RPR and STI Lab Results  Component Value Date   LABRPR NON-REACTIVE 04/08/2022   LABRPR NON-REACTIVE 01/01/2022   LABRPR NON-REACTIVE 09/26/2021   LABRPR Reactive (A) 07/23/2021   LABRPR Reactive (A) 07/05/2021    STI Results GC GC CT CT  04/08/2022  3:49 PM Negative    Negative    Negative   Negative    Negative    Negative    01/01/2022  4:40 PM Negative   Negative    01/01/2022  4:16 PM Negative    Negative   Negative    Negative    09/26/2021 11:32 AM Negative    Negative    Negative   Negative    Negative    Negative    08/14/2021 10:18 AM Negative    Negative    Negative   Negative    Negative    Negative    07/05/2021 12:58 AM Negative   Negative    06/28/2019  3:01 PM Negative   Negative    08/29/2015 10:27 AM  NOT DETECTED   NOT DETECTED     Assessment: Mario Nguyen presents to clinic today for PrEP follow-up. He has not taken Truvada since his last appointment as he has a very hard time remembering to take it when he wakes up before work. He decided he would rather stop the medicine and not restart it until  following up in clinic. States he has been with 2 new partners since his last visit but has used condoms in each sexual encounter. States condom broke during one encounter a while back but has not experienced any issues since then. He would like full STI screening  today with RPR and urine/oral/rectal cytologies as he is both the insertive and receptive partner.  He states he went to urgent care on Tuesday this week after experiencing a hot flash and almost passing out. He tested negative for COVID and flu. Describes his symptoms as sore throat, resolved fever, and alternating chills and hot flashes. No recent sexual partners, but will check HIV RNA in addition to HIV antibody today to rule out acute HIV infection.  He would like to transition to Apretude given his ongoing issues with Truvada adherence. Patient is insured though he was not aware of this; will start PA for Apretude today and reach out to him with approval and scheduling later. Counseled that Apretude is one intramuscular injection in the gluteal muscle for each visit. Explained that the second injection is 30 days after the initial injection then every 2 months thereafter. Discussed follow up appointments moving forward. It is required to have a negative HIV test immediately prior to injection administration. A rapid HIV blood test will be drawn each visit, and patient must wait for results before getting injection, which typically takes 20-30 minutes. Will make lab appointments for each injection 30 minutes prior to injection appointment. Screened patient for acute HIV symptoms such as fatigue, muscle aches, rash, sore throat, lymphadenopathy, headache, night sweats, nausea/vomiting/diarrhea, and fever. Patient denies any symptoms.   Explained that showing up to injection appointments is very important and warned that if appointments are missed, protection will be minimal and the risk of acquiring HIV becomes much higher. Counseled on possible  side effects associated with the injections such as injection site pain, which is usually mild to moderate in nature, injection site nodules, and injection site reactions. Asked to call the clinic or send me a mychart message if they experience any issues. Advised that they can take Motrin or Tylenol for injection site pain if needed. They may also pre-treat with Motrin or Tylenol about 30-45 minutes before scheduled appointments.  Plan: Check HIV antibody, HIV RNA, RPR, and urine/oral/rectal cytologies Follow-up based on Apretude PA   Alfonse Spruce, PharmD, CPP, BCIDP, AAHIVP Clinical Pharmacist Practitioner Infectious Diseases [facility] 07/04/2022, 11:53 AM

## 2022-07-04 NOTE — Telephone Encounter (Signed)
[facility] Patient Advocate Encounter   Received notification from [facility] that prior authorization for APRETUDE is required.   PA submitted on 07/04/22 Key [id number] Status is pending    [facility] will continue to follow.   Ileene Patrick, [facility] Patient [facility] for Infectious Disease Phone: [phone] Fax:  [phone]

## 2022-07-07 ENCOUNTER — Encounter: Payer: Self-pay | Admitting: Pharmacist

## 2022-07-07 ENCOUNTER — Ambulatory Visit (INDEPENDENT_AMBULATORY_CARE_PROVIDER_SITE_OTHER): Payer: 59 | Admitting: Pharmacist

## 2022-07-07 ENCOUNTER — Other Ambulatory Visit: Payer: Self-pay

## 2022-07-07 DIAGNOSIS — A549 Gonococcal infection, unspecified: Secondary | ICD-10-CM

## 2022-07-07 LAB — GC/CHLAMYDIA PROBE, AMP (THROAT)
Chlamydia trachomatis RNA: NOT DETECTED
Neisseria gonorrhoeae RNA: DETECTED — AB

## 2022-07-07 LAB — RPR: RPR Ser Ql: NONREACTIVE

## 2022-07-07 LAB — HIV-1 RNA QUANT-NO REFLEX-BLD
HIV 1 RNA Quant: NOT DETECTED Copies/mL
HIV-1 RNA Quant, Log: NOT DETECTED Log cps/mL

## 2022-07-07 LAB — HIV ANTIBODY (ROUTINE TESTING W REFLEX): HIV 1&2 Ab, 4th Generation: NONREACTIVE

## 2022-07-07 LAB — C. TRACHOMATIS/N. GONORRHOEAE RNA
C. trachomatis RNA, TMA: NOT DETECTED
N. gonorrhoeae RNA, TMA: NOT DETECTED

## 2022-07-07 LAB — CT/NG RNA, TMA RECTAL
Chlamydia Trachomatis RNA: NOT DETECTED
Neisseria Gonorrhoeae RNA: DETECTED — AB

## 2022-07-07 MED ORDER — CEFTRIAXONE SODIUM 500 MG IJ SOLR
500.0000 mg | Freq: Once | INTRAMUSCULAR | Status: AC
  Administered 2022-07-07: 500 mg via INTRAMUSCULAR

## 2022-07-07 NOTE — Progress Notes (Signed)
   [facility] Disease Pharmacy STI Visit  HPI: Mario Nguyen is a 32 y.o. male who presents to the [facility] for pharyngeal and rectal gonorrhea treatment.  Hepatitis B No results found for: "HEPBSAB" Lab Results  Component Value Date   HEPBSAG NON-REACTIVE 09/26/2021    Hepatitis C No results found for: "HCVAB"  Hepatitis A Lab Results  Component Value Date   HAV NON-REACTIVE 08/14/2021    Assessment & Plan: - Administered ceftriaxone 500mg  once   - Patient tolerated well - Patient aware to refrain from sexual activity x 7 days and to inform partners of test results - Retest for pharyngeal gonorrhea on 3/28 with me  - Provided patient with print out of his insurance information   Alfonse Spruce, PharmD, CPP, [place], [place] Clinical Pharmacist Practitioner Infectious Diseases [facility] 07/07/2022, 2:41 PM

## 2022-07-08 ENCOUNTER — Other Ambulatory Visit (HOSPITAL_COMMUNITY): Payer: Self-pay

## 2022-07-08 ENCOUNTER — Other Ambulatory Visit: Payer: Self-pay | Admitting: Pharmacist

## 2022-07-08 DIAGNOSIS — Z79899 Other long term (current) drug therapy: Secondary | ICD-10-CM

## 2022-07-08 MED ORDER — APRETUDE 600 MG/3ML IM SUER: 600.0000 mg | INTRAMUSCULAR | 5 refills | Status: DC

## 2022-07-08 MED ORDER — APRETUDE 600 MG/3ML IM SUER: 600.0000 mg | INTRAMUSCULAR | 1 refills | Status: DC

## 2022-07-08 NOTE — Progress Notes (Signed)
To run test claims

## 2022-07-09 ENCOUNTER — Ambulatory Visit: Payer: Self-pay | Admitting: Pharmacist

## 2022-07-10 ENCOUNTER — Telehealth: Payer: Self-pay

## 2022-07-10 ENCOUNTER — Other Ambulatory Visit (HOSPITAL_COMMUNITY): Payer: Self-pay

## 2022-07-10 NOTE — Telephone Encounter (Signed)
[facility] Patient Advocate Encounter  Apretude is covered under the patient medical plan which the prescription will be filled at [facility] , they will then deliver the medication to the office.   Patient copay card  BIN: [id number] PCN: [id number] Group Number : [id number] ID : [id number]  I called [facility] and updated them on the billing information.  The patient will have to call [facility] for the 1st fill and give them permission for the office to do further fills.  Ileene Patrick, [facility] Patient [facility] for Infectious Disease Phone: [phone] Fax:  [phone]

## 2022-07-17 ENCOUNTER — Other Ambulatory Visit: Payer: Self-pay

## 2022-07-17 ENCOUNTER — Ambulatory Visit (INDEPENDENT_AMBULATORY_CARE_PROVIDER_SITE_OTHER): Payer: 59 | Admitting: Pharmacist

## 2022-07-17 DIAGNOSIS — A549 Gonococcal infection, unspecified: Secondary | ICD-10-CM | POA: Diagnosis not present

## 2022-07-17 NOTE — Progress Notes (Signed)
07/17/2022  HPI: Mario Nguyen is a 32 y.o. male who presents to the [place] clinic today for STI testing.  Patient Active Problem List   Diagnosis Date Noted   Encounter for general adult medical examination with abnormal findings 09/10/2021   Encounter for examination following treatment at hospital 07/09/2021   Syphilis 07/09/2021   LAD (lymphadenopathy), inguinal 07/09/2021   Gastroesophageal reflux disease without esophagitis 05/28/2021   Hemorrhoids 08/01/2020   Depression, major, single episode, moderate ([place]) 08/01/2020   Intertrigo 08/01/2020   Tobacco use disorder 08/03/2013    Patient's Medications  New Prescriptions   No medications on file  Previous Medications   CABOTEGRAVIR ER (APRETUDE) 600 MG/3ML INJECTION    Inject 3 mLs (600 mg total) into the muscle every 30 (thirty) days.   CABOTEGRAVIR ER (APRETUDE) 600 MG/3ML INJECTION    Inject 3 mLs (600 mg total) into the muscle every 2 (two) months.   ONDANSETRON (ZOFRAN) 4 MG TABLET    Take 1 tablet (4 mg total) by mouth every 6 (six) hours.  Modified Medications   No medications on file  Discontinued Medications   No medications on file    Allergies: No Known Allergies  Past Medical History: Past Medical History:  Diagnosis Date   Encounter for hepatitis C screening test for low risk patient 06/28/2019    Social History: Social History   Socioeconomic History   Marital status: Single    Spouse name: Not on file   Number of children: Not on file   Years of education: 10   Highest education level: 10th grade  Occupational History   Not on file  Tobacco Use   Smoking status: Every Day    Packs/day: 0.30    Years: 6.00    Additional pack years: 0.00    Total pack years: 1.80    Types: Cigarettes   Smokeless tobacco: Never   Tobacco comments:    States working on quitting  Vaping Use   Vaping Use: Never used  Substance and Sexual Activity   Alcohol use: Yes    Comment: occasional   Drug  use: Yes    Frequency: 7.0 times per week    Types: Marijuana   Sexual activity: Yes    Partners: Male    Birth control/protection: None    Comment: accepted condoms  Other Topics Concern   Not on file  Social History Narrative   Lives fiance Mario Nguyen 8 years    Dog: [place] Storm y 3 years      Enjoys gardening, listening to music, just relaxing.      Diet: Well balanced, but does like to eat sweets.   Caffeine: Does not drink much tea or sodas, does not drink coffee, most drinks Gatorade.    Water: 3-5 bottles of water daily      Wears seat belt   Does not wear sun protection   Smoke detectors   Occasional use of phone while driving   Social Determinants of Health   Financial Resource Strain: Low Risk  (09/28/2018)   Overall Financial Resource Strain (CARDIA)    Difficulty of Paying Living Expenses: Not hard at all  Food Insecurity: No Food Insecurity (09/28/2018)   Hunger Vital Sign    Worried About Running Out of Food in the Last Year: Never true    Ran Out of Food in the Last Year: Never true  Transportation Needs: No Transportation Needs (09/28/2018)   PRAPARE - Transportation    Lack  of Transportation (Medical): No    Lack of Transportation (Non-Medical): No  Physical Activity: Insufficiently Active (09/28/2018)   Exercise Vital Sign    Days of Exercise per Week: 1 day    Minutes of Exercise per Session: 30 min  Stress: Stress Concern Present (09/28/2018)   [place]    Feeling of Stress : To some extent  Social Connections: Moderately Isolated (09/28/2018)   Social Connection and Isolation Panel [NHANES]    Frequency of Communication with Friends and Family: Once a week    Frequency of Social Gatherings with Friends and Family: More than three times a week    Attends Religious Services: Never    Marine scientist or Organizations: No    Attends Archivist Meetings: Never    Marital Status:  Never married     Assessment: Mario Nguyen presents today for oral gonorrhea test of cure. He reports no new signs/symptoms of an STI and wishes to only receive oral STI testing today.   He is currently on Truvada for PrEP. He has been counseled on Apretude in previous appointments and the medication is approved once the patient calls [facility] for it to be filled. After speaking with the patient today, he is having second thoughts about Apretude with hesitation regarding the needle. He will continue to think about the medication but wishes to continue Truvada for now. He reports having ~1.5 bottles of Truvada at home, so we will follow-up with him again in one month.   The patient is eligible for several vaccines, including HepA, HepB, flu, covid, HPV, and Mpox. Lack of insurance prevented [place] from giving these in the past, however, the patient is now insured. The patient wishes to defer vaccines to his next appointment.    Plan: - Get oral cytology for gonorrhea test of cure  - Next PrEP follow-up appointment scheduled for 08/20/22 with Mario Nguyen, PharmD PGY1 Pharmacy Resident 3/28/20249:33 AM

## 2022-07-18 LAB — GC/CHLAMYDIA PROBE, AMP (THROAT)
Chlamydia trachomatis RNA: NOT DETECTED
Neisseria gonorrhoeae RNA: NOT DETECTED

## 2022-08-20 ENCOUNTER — Other Ambulatory Visit: Payer: Self-pay

## 2022-08-20 ENCOUNTER — Ambulatory Visit (INDEPENDENT_AMBULATORY_CARE_PROVIDER_SITE_OTHER): Payer: 59 | Admitting: Pharmacist

## 2022-08-20 ENCOUNTER — Other Ambulatory Visit (HOSPITAL_COMMUNITY)
Admission: RE | Admit: 2022-08-20 | Discharge: 2022-08-20 | Disposition: A | Discharge status: Home / Self Care | Payer: 59 | Source: Ambulatory Visit | Attending: Internal Medicine | Admitting: Internal Medicine

## 2022-08-20 ENCOUNTER — Other Ambulatory Visit (HOSPITAL_COMMUNITY): Payer: Self-pay

## 2022-08-20 DIAGNOSIS — Z113 Encounter for screening for infections with a predominantly sexual mode of transmission: Secondary | ICD-10-CM

## 2022-08-20 DIAGNOSIS — Z79899 Other long term (current) drug therapy: Secondary | ICD-10-CM

## 2022-08-20 NOTE — Progress Notes (Signed)
Date:  08/20/2022   HPI: Mario Nguyen is a 32 y.o. male who presents [facility] the [facility] for HIV PrEP follow-up.  Insured   [x]    Uninsured  []    Patient Active Problem List   Diagnosis Date Noted   Encounter for general adult medical examination with abnormal findings 09/10/2021   Encounter for examination following treatment at hospital 07/09/2021   Syphilis 07/09/2021   LAD (lymphadenopathy), inguinal 07/09/2021   Gastroesophageal reflux disease without esophagitis 05/28/2021   Hemorrhoids 08/01/2020   Depression, major, single episode, moderate ([facility]) 08/01/2020   Intertrigo 08/01/2020   Tobacco use disorder 08/03/2013    Patient's Medications  New Prescriptions   No medications on file  Previous Medications   ONDANSETRON (ZOFRAN) 4 MG TABLET    Take 1 tablet (4 mg total) by mouth every 6 (six) hours.  Modified Medications   No medications on file  Discontinued Medications   CABOTEGRAVIR ER (APRETUDE) 600 MG/3ML INJECTION    Inject 3 mLs (600 mg total) into the muscle every 30 (thirty) days.   CABOTEGRAVIR ER (APRETUDE) 600 MG/3ML INJECTION    Inject 3 mLs (600 mg total) into the muscle every 2 (two) months.    Allergies: No Known Allergies  Past Medical History: Past Medical History:  Diagnosis Date   Encounter for hepatitis C screening test for low risk patient 06/28/2019    Social History: Social History   Socioeconomic History   Marital status: Single    Spouse name: Not on file   Number of children: Not on file   Years of education: 10   Highest education level: 10th grade  Occupational History   Not on file  Tobacco Use   Smoking status: Every Day    Packs/day: 0.30    Years: 6.00    Additional pack years: 0.00    Total pack years: 1.80    Types: Cigarettes   Smokeless tobacco: Never   Tobacco comments:    States working on quitting  Vaping Use   Vaping Use: Never used  Substance and Sexual Activity   Alcohol use: Yes     Comment: occasional   Drug use: Yes    Frequency: 7.0 times per week    Types: Marijuana   Sexual activity: Yes    Partners: Male    Birth control/protection: None    Comment: accepted condoms  Other Topics Concern   Not on file  Social History Narrative   Lives fiance [place] 8 years    Dog: [place] Storm y 3 years      Enjoys gardening, listening to music, just relaxing.      Diet: Well balanced, but does like to eat sweets.   Caffeine: Does not drink much tea or sodas, does not drink coffee, most drinks Gatorade.    Water: 3-5 bottles of water daily      Wears seat belt   Does not wear sun protection   Smoke detectors   Occasional use of phone while driving   Social Determinants of Health   Financial Resource Strain: Low Risk  (09/28/2018)   Overall Financial Resource Strain (CARDIA)    Difficulty of Paying Living Expenses: Not hard at all  Food Insecurity: No Food Insecurity (09/28/2018)   Hunger Vital Sign    Worried About Running Out of Food in the Last Year: Never true    Ran Out of Food in the Last Year: Never true  Transportation Needs: No Transportation Needs (09/28/2018)  PRAPARE - Administrator, Civil Service (Medical): No    Lack of Transportation (Non-Medical): No  Physical Activity: Insufficiently Active (09/28/2018)   Exercise Vital Sign    Days of Exercise per Week: 1 day    Minutes of Exercise per Session: 30 min  Stress: Stress Concern Present (09/28/2018)   [employer] of Occupational Health - Occupational Stress Questionnaire    Feeling of Stress : To some extent  Social Connections: Moderately Isolated (09/28/2018)   Social Connection and Isolation Panel [NHANES]    Frequency of Communication with Friends and Family: Once a week    Frequency of Social Gatherings with Friends and Family: More than three times a week    Attends Religious Services: Never    Database administrator or Organizations: No    Attends Banker Meetings:  Never    Marital Status: Never married       04/08/2022    3:36 PM  CHL HIV PREP FLOWSHEET RESULTS  Insurance Status Uninsured  Gender at birth Male  Gender identity cis-Male  Risk for HIV Hx of STI  Sex Partners Men only  # sex partners past 3-6 mos 2  Sex activity preferences Insertive and receptive;Oral  Condom use Yes  % condom use 50  Treated for STI? No  PrEP Eligibility Yes    Labs:  SCr: Lab Results  Component Value Date   CREATININE 1.12 01/01/2022   CREATININE 1.06 07/05/2021   CREATININE 1.17 05/28/2021   CREATININE 1.02 08/01/2020   CREATININE 1.16 11/04/2016   HIV Lab Results  Component Value Date   HIV NON-REACTIVE 07/04/2022   HIV NON-REACTIVE 04/08/2022   HIV NON-REACTIVE 08/14/2021   HIV Non Reactive 07/23/2021   HIV NON REACTIVE 07/05/2021   Hepatitis B Lab Results  Component Value Date   HEPBSAG NON-REACTIVE 09/26/2021   HEPBCAB NON-REACTIVE 09/26/2021   Hepatitis C Lab Results  Component Value Date   HEPCAB NON-REACTIVE 09/26/2021   Hepatitis A Lab Results  Component Value Date   HAV NON-REACTIVE 08/14/2021   RPR and STI Lab Results  Component Value Date   LABRPR NON-REACTIVE 07/04/2022   LABRPR NON-REACTIVE 04/08/2022   LABRPR NON-REACTIVE 01/01/2022   LABRPR NON-REACTIVE 09/26/2021   LABRPR Reactive (A) 07/23/2021    STI Results GC GC CT CT  04/08/2022  3:49 PM Negative    Negative    Negative   Negative    Negative    Negative    01/01/2022  4:40 PM Negative   Negative    01/01/2022  4:16 PM Negative    Negative   Negative    Negative    09/26/2021 11:32 AM Negative    Negative    Negative   Negative    Negative    Negative    08/14/2021 10:18 AM Negative    Negative    Negative   Negative    Negative    Negative    07/05/2021 12:58 AM Negative   Negative    06/28/2019  3:01 PM Negative   Negative    08/29/2015 10:27 AM  NOT DETECTED   NOT DETECTED     Assessment: Mario Nguyen presents to clinic today for  PrEP follow-up. He has been taking Truvada and decided he was not interested in Apretude due to the needles. 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 Discussed taking his Truvada before he leaves for work in the  evenings which he will try; he thinks this will work better for him. Tested positive for gonorrhea in March and was appropriately treated. Screened patient for acute HIV symptoms such as fatigue, muscle aches, rash, sore throat, lymphadenopathy, headache, night sweats, nausea/vomiting/diarrhea, and fever. One new sexual partner since last visit; will check urine/oral cytologies today. Will check HIV antibody and send in refill once it results negative. Patient has previously filled through [facility] but is able to fill for delivery through [facility] now. Lupita Leash completing PA for Truvada.   Offered HPV and HAV vaccines to him today which he deferred; will look into them more and consider at his next visit.   Plan: Check HIV antibody and urine/oral cytologies If HIV antibody, refill Truvada x 3 months Follow-up with me on 8/1  Margarite Gouge, PharmD, CPP, BCIDP, AAHIVP Clinical Pharmacist Practitioner Infectious Diseases Clinical Pharmacist [facility] 08/20/2022, 9:41 AM

## 2022-08-21 ENCOUNTER — Encounter: Payer: Self-pay | Admitting: Pharmacist

## 2022-08-21 ENCOUNTER — Other Ambulatory Visit (HOSPITAL_COMMUNITY): Payer: Self-pay

## 2022-08-21 LAB — HIV ANTIBODY (ROUTINE TESTING W REFLEX): HIV 1&2 Ab, 4th Generation: NONREACTIVE

## 2022-08-21 LAB — URINE CYTOLOGY ANCILLARY ONLY
Chlamydia: NEGATIVE
Comment: NEGATIVE
Comment: NORMAL
Neisseria Gonorrhea: NEGATIVE

## 2022-08-21 LAB — CYTOLOGY, (ORAL, ANAL, URETHRAL) ANCILLARY ONLY
Chlamydia: NEGATIVE
Comment: NEGATIVE
Comment: NORMAL
Neisseria Gonorrhea: NEGATIVE

## 2022-08-22 ENCOUNTER — Other Ambulatory Visit: Payer: Self-pay | Admitting: Pharmacist

## 2022-08-22 ENCOUNTER — Other Ambulatory Visit: Payer: Self-pay

## 2022-08-22 ENCOUNTER — Telehealth: Payer: Self-pay

## 2022-08-22 ENCOUNTER — Other Ambulatory Visit (HOSPITAL_COMMUNITY): Payer: Self-pay

## 2022-08-22 DIAGNOSIS — Z79899 Other long term (current) drug therapy: Secondary | ICD-10-CM

## 2022-08-22 MED ORDER — DESCOVY 200-25 MG PO TABS
1.0000 | ORAL_TABLET | Freq: Every day | ORAL | 2 refills | Status: DC
  Filled 2022-08-22 (×2): qty 30, 30d supply, fill #0

## 2022-08-22 NOTE — Progress Notes (Signed)
In error - Lupita Leash completing copay card for him. Prefers mailing.  Margarite Gouge, PharmD, CPP, BCIDP, AAHIVP Clinical Pharmacist Practitioner Infectious Diseases Clinical Pharmacist [facility] for Infectious Disease

## 2022-08-22 NOTE — Telephone Encounter (Signed)
[facility] Patient Advocate Encounter °  °Was successful in obtaining a [employer] copay card for [place].  This copay card will make the patients copay 0.00. ° °I have spoken with the patient.   ° °The billing information is as follows and has been shared with [facility]. ° ° ° ° ° ° °Gavan Nordby, CPhT °Specialty Pharmacy Patient Advocate °[facility] °Phone: [phone] °Fax:  [phone]  °

## 2022-08-29 ENCOUNTER — Encounter (HOSPITAL_COMMUNITY): Payer: Self-pay | Admitting: Emergency Medicine

## 2022-08-29 ENCOUNTER — Emergency Department (HOSPITAL_COMMUNITY): Payer: 59

## 2022-08-29 ENCOUNTER — Other Ambulatory Visit: Payer: Self-pay

## 2022-08-29 ENCOUNTER — Emergency Department (HOSPITAL_COMMUNITY)
Admission: EM | Admit: 2022-08-29 | Discharge: 2022-08-29 | Disposition: A | Discharge status: Home / Self Care | Payer: 59 | Attending: Emergency Medicine | Admitting: Emergency Medicine

## 2022-08-29 DIAGNOSIS — F17210 Nicotine dependence, cigarettes, uncomplicated: Secondary | ICD-10-CM | POA: Diagnosis not present

## 2022-08-29 DIAGNOSIS — R519 Headache, unspecified: Secondary | ICD-10-CM | POA: Diagnosis not present

## 2022-08-29 MED ORDER — DIPHENHYDRAMINE HCL 50 MG/ML IJ SOLN
12.5000 mg | Freq: Once | INTRAMUSCULAR | Status: AC
  Administered 2022-08-29: 12.5 mg via INTRAVENOUS
  Filled 2022-08-29: qty 1

## 2022-08-29 MED ORDER — PROCHLORPERAZINE EDISYLATE 10 MG/2ML IJ SOLN
10.0000 mg | Freq: Once | INTRAMUSCULAR | Status: AC
  Administered 2022-08-29: 10 mg via INTRAVENOUS
  Filled 2022-08-29: qty 2

## 2022-08-29 MED ORDER — KETOROLAC TROMETHAMINE 15 MG/ML IJ SOLN
15.0000 mg | Freq: Once | INTRAMUSCULAR | Status: AC
  Administered 2022-08-29: 15 mg via INTRAVENOUS
  Filled 2022-08-29: qty 1

## 2022-08-29 NOTE — ED Provider Notes (Signed)
[place] [facility] AT [facility] Provider Note  CSN: [id number] Arrival date & time: 08/29/22 0715  Chief Complaint(s) Headache  HPI Mario Nguyen is a 32 y.o. male who denies significant past medical history presenting to the emergency department with headache.  He reports headache and suddenly around 5:30 AM.  Denies similar headache in the past.  No photophobia on my interview although triage note says maybe photophobia.  No head trauma.  No nausea, vomiting, diarrhea.  No neck stiffness.  No fevers or chills.  Reports he feels as if he bit his tongue.  No numbness, tingling, weakness, visual changes, trouble swallowing, trouble speaking.  Took Tylenol without relief.   Past Medical History Past Medical History:  Diagnosis Date   Encounter for hepatitis C screening test for low risk patient 06/28/2019   Patient Active Problem List   Diagnosis Date Noted   Encounter for general adult medical examination with abnormal findings 09/10/2021   Encounter for examination following treatment at hospital 07/09/2021   Syphilis 07/09/2021   LAD (lymphadenopathy), inguinal 07/09/2021   Gastroesophageal reflux disease without esophagitis 05/28/2021   Hemorrhoids 08/01/2020   Depression, major, single episode, moderate (HCC) 08/01/2020   Intertrigo 08/01/2020   Tobacco use disorder 08/03/2013   Home Medication(s) Prior to Admission medications   Medication Sig Start Date End Date Taking? Authorizing Provider  emtricitabine-tenofovir AF (DESCOVY) 200-25 MG tablet Take 1 tablet by mouth daily. 08/22/22   Jennette Kettle, RPH-CPP  ondansetron (ZOFRAN) 4 MG tablet Take 1 tablet (4 mg total) by mouth every 6 (six) hours. 07/02/22   Barbette Merino, NP                                                                                                                                    Past Surgical History History reviewed. No pertinent surgical history. Family  History Family History  Problem Relation Age of Onset   Hypertension Mother    Learning disabilities Maternal Grandmother    Stroke Maternal Grandfather     Social History Social History   Tobacco Use   Smoking status: Some Days    Packs/day: 0.30    Years: 6.00    Additional pack years: 0.00    Total pack years: 1.80    Types: Cigarettes   Smokeless tobacco: Never   Tobacco comments:    States working on quitting  Vaping Use   Vaping Use: Never used  Substance Use Topics   Alcohol use: Yes    Comment: occasional   Drug use: Yes    Frequency: 7.0 times per week    Types: Marijuana   Allergies Patient has no known allergies.  Review of Systems Review of Systems  All other systems reviewed and are negative.   Physical Exam Vital Signs  I have reviewed the triage vital signs BP 130/89   Pulse 66   Temp 98.2 F (  36.8 C) (Oral)   Resp 16   SpO2 98%  Physical Exam Vitals and nursing note reviewed.  Constitutional:      General: He is not in acute distress.    Appearance: Normal appearance.  HENT:     Mouth/Throat:     Mouth: Mucous membranes are moist.  Eyes:     Conjunctiva/sclera: Conjunctivae normal.  Cardiovascular:     Rate and Rhythm: Normal rate and regular rhythm.  Pulmonary:     Effort: Pulmonary effort is normal. No respiratory distress.     Breath sounds: Normal breath sounds.  Abdominal:     General: Abdomen is flat.     Palpations: Abdomen is soft.     Tenderness: There is no abdominal tenderness.  Musculoskeletal:     Right lower leg: No edema.     Left lower leg: No edema.  Skin:    General: Skin is warm and dry.     Capillary Refill: Capillary refill takes less than 2 seconds.  Neurological:     Mental Status: He is alert and oriented to person, place, and time. Mental status is at baseline.     Comments: Cranial nerves II through XII intact, strength 5 out of 5 in the bilateral upper and lower extremities, no sensory deficit to  light touch, no dysmetria on finger-nose-finger testing, ambulatory with steady gait.  Psychiatric:        Mood and Affect: Mood normal.        Behavior: Behavior normal.     ED Results and Treatments Labs (all labs ordered are listed, but only abnormal results are displayed) Labs Reviewed - No data to display                                                                                                                        Radiology CT Head Wo Contrast  Result Date: 08/29/2022 CLINICAL DATA:  Headache, sudden and severe. EXAM: CT HEAD WITHOUT CONTRAST TECHNIQUE: Contiguous axial images were obtained from the base of the skull through the vertex without intravenous contrast. RADIATION DOSE REDUCTION: This exam was performed according to the departmental dose-optimization program which includes automated exposure control, adjustment of the mA and/or kV according to patient size and/or use of iterative reconstruction technique. COMPARISON:  CT head dated January 24, 2019 FINDINGS: Brain: No evidence of acute infarction, hemorrhage, hydrocephalus, extra-axial collection or mass lesion/mass effect. Vascular: No hyperdense vessel or unexpected calcification. Skull: Normal. Negative for fracture or focal lesion. Sinuses/Orbits: No acute finding. Other: None. IMPRESSION: No acute intracranial pathology. Electronically Signed   By: Larose Hires D.O.   On: 08/29/2022 09:02    Pertinent labs & imaging results that were available during my care of the patient were reviewed by me and considered in my medical decision making (see MDM for details).  Medications Ordered in ED Medications  prochlorperazine (COMPAZINE) injection 10 mg (10 mg Intravenous Given 08/29/22 0829)  diphenhydrAMINE (BENADRYL) injection 12.5 mg (12.5 mg Intravenous  Given 08/29/22 0831)  ketorolac (TORADOL) 15 MG/ML injection 15 mg (15 mg Intravenous Given 08/29/22 1610)                                                                                                                                      Procedures Procedures  (including critical care time)  Medical Decision Making / ED Course   MDM:  32 year old male presenting to the emergency department with headache.  Patient is overall well-appearing, neurologic exam is reassuring.  Differential includes subarachnoid hemorrhage, migraine headache, cluster headache.  Less likely tension headache.  Given sudden onset doubt mass effect from tumor.  No head trauma to suggest traumatic intracranial bleeding.  Will obtain CT scan.  Given recent onset of headache should rule out subarachnoid if negative.  Will treat symptoms and reassess.  Clinical Course as of 08/29/22 1002  Fri Aug 29, 2022  1001 Headache resolved after migraine cocktail.  CT head negative and within 6 hours of symptom onset so extremely low concern for subarachnoid hemorrhage.  Suspect likely migraine or cluster type headache. Will discharge patient to home. All questions answered. Patient comfortable with plan of discharge. Return precautions discussed with patient and specified on the after visit summary.  [WS]    Clinical Course User Index [WS] Lonell Grandchild, MD     Additional history obtained:  -External records from outside source obtained and reviewed including: Chart review including previous notes, labs, imaging, consultation notes including ID notes, on truvada for pre-exposure prophylaxis. No HIV     Imaging Studies ordered: I ordered imaging studies including CT head On my interpretation imaging demonstrates no acute process I independently visualized and interpreted imaging. I agree with the radiologist interpretation   Medicines ordered and prescription drug management: Meds ordered this encounter  Medications   prochlorperazine (COMPAZINE) injection 10 mg   diphenhydrAMINE (BENADRYL) injection 12.5 mg   ketorolac (TORADOL) 15 MG/ML injection 15 mg    -I have reviewed  the patients home medicines and have made adjustments as needed  Reevaluation: After the interventions noted above, I reevaluated the patient and found that their symptoms have resolved  Co morbidities that complicate the patient evaluation  Past Medical History:  Diagnosis Date   Encounter for hepatitis C screening test for low risk patient 06/28/2019      Dispostion: Disposition decision including need for hospitalization was considered, and patient discharged from emergency department.    Final Clinical Impression(s) / ED Diagnoses Final diagnoses:  Bad headache     This chart was dictated using voice recognition software.  Despite best efforts to proofread,  errors can occur which can change the documentation meaning.    Lonell Grandchild, MD 08/29/22 1002

## 2022-08-29 NOTE — ED Triage Notes (Addendum)
Pt c/o sudden headache to left temple at 0530 this morning while at work. States is a sharp constant pain. Denies vision changes. Denies n/v/d. Pt denies trouble swallowing/speaking. Light sometimes makes headache worse. Also states the left side of his tongue feels like he bit it but he didn't. Sensory same bilateral to face/arms/legs. A/o. Ambulatory with steady gait.  Pt took tylenol at 0530 and 7

## 2022-08-29 NOTE — Discharge Instructions (Addendum)
We evaluated you for your headache.  Your symptoms improved in the emergency department.  Your symptoms were likely due to a migraine headache or a cluster headache.  These can both cause severe headaches.  Your CT scan did not show any dangerous process such as bleeding in your brain or brain tumor.  Please follow-up with your primary doctor.  If your symptoms return or you develop any fevers or neck stiffness, weakness on one side of your body or the other, trouble walking, persistent vomiting, or any other concerning symptoms, please return to the emergency department for reassessment.

## 2022-09-16 ENCOUNTER — Encounter: Payer: BC Managed Care – PPO | Admitting: Internal Medicine

## 2022-09-16 ENCOUNTER — Other Ambulatory Visit (HOSPITAL_COMMUNITY): Payer: Self-pay

## 2022-09-17 ENCOUNTER — Telehealth: Payer: 59 | Admitting: Nurse Practitioner

## 2022-09-17 DIAGNOSIS — K047 Periapical abscess without sinus: Secondary | ICD-10-CM

## 2022-09-17 MED ORDER — PENICILLIN V POTASSIUM 500 MG PO TABS: 500.0000 mg | ORAL_TABLET | Freq: Three times a day (TID) | ORAL | 0 refills | Status: AC

## 2022-09-17 NOTE — Progress Notes (Signed)
E-Visit for Dental Pain  We are sorry that you are not feeling well.  Here is how we plan to help!  Based on what you have shared with me in the questionnaire, it sounds like you have an infection around one of your teeth  Pen VK 500mg 3 times a day for 7 days  It is imperative that you see a dentist within 10 days of this eVisit to determine the cause of the dental pain and be sure it is adequately treated  A toothache or tooth pain is caused when the nerve in the root of a tooth or surrounding a tooth is irritated. Dental (tooth) infection, decay, injury, or loss of a tooth are the most common causes of dental pain. Pain may also occur after an extraction (tooth is pulled out). Pain sometimes originates from other areas and radiates to the jaw, thus appearing to be tooth pain.Bacteria growing inside your mouth can contribute to gum disease and dental decay, both of which can cause pain. A toothache occurs from inflammation of the central portion of the tooth called pulp. The pulp contains nerve endings that are very sensitive to pain. Inflammation to the pulp or pulpitis may be caused by dental cavities, trauma, and infection.    HOME CARE:   For toothaches: Over-the-counter pain medications such as acetaminophen or ibuprofen may be used. Take these as directed on the package while you arrange for a dental appointment. Avoid very cold or hot foods, because they may make the pain worse. You may get relief from biting on a cotton ball soaked in oil of cloves. You can get oil of cloves at most drug stores.  For jaw pain:  Aspirin may be helpful for problems in the joint of the jaw in adults. If pain happens every time you open your mouth widely, the temporomandibular joint (TMJ) may be the source of the pain. Yawning or taking a large bite of food may worsen the pain. An appointment with your doctor or dentist will help you find the cause.     GET HELP RIGHT AWAY IF:  You have a high fever  or chills If you have had a recent head or face injury and develop headache, light headedness, nausea, vomiting, or other symptoms that concern you after an injury to your face or mouth, you could have a more serious injury in addition to your dental injury. A facial rash associated with a toothache: This condition may improve with medication. Contact your doctor for them to decide what is appropriate. Any jaw pain occurring with chest pain: Although jaw pain is most commonly caused by dental disease, it is sometimes referred pain from other areas. People with heart disease, especially people who have had stents placed, people with diabetes, or those who have had heart surgery may have jaw pain as a symptom of heart attack or angina. If your jaw or tooth pain is associated with lightheadedness, sweating, or shortness of breath, you should see a doctor as soon as possible. Trouble swallowing or excessive pain or bleeding from gums: If you have a history of a weakened immune system, diabetes, or steroid use, you may be more susceptible to infections. Infections can often be more severe and extensive or caused by unusual organisms. Dental and gum infections in people with these conditions may require more aggressive treatment. An abscess may need draining or IV antibiotics, for example.  MAKE SURE YOU   Understand these instructions. Will watch your condition. Will get   help right away if you are not doing well or get worse.  Thank you for choosing an e-visit.  Your e-visit answers were reviewed by a board certified advanced clinical practitioner to complete your personal care plan. Depending upon the condition, your plan could have included both over the counter or prescription medications.  Please review your pharmacy choice. Make sure the pharmacy is open so you can pick up prescription now. If there is a problem, you may contact your provider through MyChart messaging and have the prescription routed  to another pharmacy.  Your safety is important to us. If you have drug allergies check your prescription carefully.   For the next 24 hours you can use MyChart to ask questions about today's visit, request a non-urgent call back, or ask for a work or school excuse. You will get an email in the next two days asking about your experience. I hope that your e-visit has been valuable and will speed your recovery.   Meds ordered this encounter  Medications   penicillin v potassium (VEETID) 500 MG tablet    Sig: Take 1 tablet (500 mg total) by mouth 3 (three) times daily for 7 days.    Dispense:  21 tablet    Refill:  0    I spent approximately 5 minutes reviewing the patient's history, current symptoms and coordinating their care today.   

## 2022-09-19 ENCOUNTER — Other Ambulatory Visit (HOSPITAL_COMMUNITY): Payer: Self-pay

## 2022-09-22 ENCOUNTER — Other Ambulatory Visit (HOSPITAL_COMMUNITY): Payer: Self-pay

## 2022-09-26 ENCOUNTER — Telehealth: Payer: Self-pay | Admitting: Internal Medicine

## 2022-09-26 NOTE — Telephone Encounter (Signed)
Pt called and stated he had a missed call from [place]. I checked the recent encounters and I did not see anything, so I'm  not sure who may have called.

## 2022-09-26 NOTE — Telephone Encounter (Signed)
Dialed number by mistake.

## 2022-09-30 ENCOUNTER — Telehealth: Payer: Self-pay | Admitting: Pharmacy Technician

## 2022-09-30 ENCOUNTER — Other Ambulatory Visit (HOSPITAL_COMMUNITY): Payer: Self-pay

## 2022-09-30 NOTE — Telephone Encounter (Signed)
Sent msg to pt

## 2022-10-01 ENCOUNTER — Encounter: Payer: Self-pay | Admitting: Internal Medicine

## 2022-10-01 ENCOUNTER — Ambulatory Visit (INDEPENDENT_AMBULATORY_CARE_PROVIDER_SITE_OTHER): Payer: 59 | Admitting: Internal Medicine

## 2022-10-01 VITALS — BP 134/70 | HR 78 | Ht 65.0 in | Wt 143.6 lb

## 2022-10-01 DIAGNOSIS — E782 Mixed hyperlipidemia: Secondary | ICD-10-CM

## 2022-10-01 DIAGNOSIS — Z720 Tobacco use: Secondary | ICD-10-CM

## 2022-10-01 DIAGNOSIS — N5089 Other specified disorders of the male genital organs: Secondary | ICD-10-CM | POA: Diagnosis not present

## 2022-10-01 DIAGNOSIS — R7989 Other specified abnormal findings of blood chemistry: Secondary | ICD-10-CM

## 2022-10-01 DIAGNOSIS — Z0001 Encounter for general adult medical examination with abnormal findings: Secondary | ICD-10-CM | POA: Diagnosis not present

## 2022-10-01 DIAGNOSIS — F321 Major depressive disorder, single episode, moderate: Secondary | ICD-10-CM

## 2022-10-01 MED ORDER — CLOTRIMAZOLE-BETAMETHASONE 1-0.05 % EX CREA
1.0000 | TOPICAL_CREAM | Freq: Every day | CUTANEOUS | 0 refills | Status: DC
Start: 2022-10-01 — End: 2023-12-02

## 2022-10-01 NOTE — Assessment & Plan Note (Signed)
Flowsheet Row Office Visit from 07/09/2021 in [facility]  PHQ-9 Total Score 0      Resolved now Was on Wellbutrin

## 2022-10-01 NOTE — Assessment & Plan Note (Signed)
Lab Results  Component Value Date   TSH 0.261 (L) 05/28/2021   Asymptomatic Check TSH and free T4

## 2022-10-01 NOTE — Assessment & Plan Note (Signed)
Has been trying cut down - states he smokes about 6-7 cigarette/day  Asked about quitting: confirms that he currently smokes cigarettes Advise to quit smoking: Educated about QUITTING to reduce the risk of cancer, cardio and cerebrovascular disease. Assess willingness: Unwilling to quit at this time, but is working on cutting back. Assist with counseling and pharmacotherapy: Counseled for 5 minutes and literature provided. Arrange for follow up: follow up in 3 months and continue to offer help. 

## 2022-10-01 NOTE — Assessment & Plan Note (Signed)
Physical exam as documented. Fasting blood tests ordered. 

## 2022-10-01 NOTE — Patient Instructions (Signed)
Please try to cut down -> quit smoking.  Please continue to follow heart healthy diet and perform moderate exercise/walking at least 150 mins/week.

## 2022-10-01 NOTE — Progress Notes (Signed)
Established Patient Office Visit  Subjective:  Patient ID: Mario Nguyen, male    DOB: 1990/07/20  Age: 32 y.o. MRN: [id number]  CC:  Chief Complaint  Patient presents with   Annual Exam    HPI Mario Nguyen is a 32 y.o. male with past medical history of depression and tobacco abuse who presents for annual physical.  He was evaluated by ID clinic for PrEP.  He reports a scrotal area rash/ulcer for the last 1 week.  He has history of syphilis and gonorrhea, treated.  Denies any dysuria, hematuria or urethral discharge currently.  He has started [employer] for PrEP.  He has been smoking about 6-7 cigarettes/day.     Past Medical History:  Diagnosis Date   Encounter for hepatitis C screening test for low risk patient 06/28/2019    History reviewed. No pertinent surgical history.  Family History  Problem Relation Age of Onset   Hypertension Mother    Learning disabilities Maternal Grandmother    Stroke Maternal Grandfather     Social History   Socioeconomic History   Marital status: Single    Spouse name: Not on file   Number of children: Not on file   Years of education: 10   Highest education level: 10th grade  Occupational History   Not on file  Tobacco Use   Smoking status: Some Days    Packs/day: 0.30    Years: 6.00    Additional pack years: 0.00    Total pack years: 1.80    Types: Cigarettes   Smokeless tobacco: Never   Tobacco comments:    States working on quitting  Vaping Use   Vaping Use: Never used  Substance and Sexual Activity   Alcohol use: Yes    Comment: occasional   Drug use: Yes    Frequency: 7.0 times per week    Types: Marijuana   Sexual activity: Yes    Partners: Male    Birth control/protection: None    Comment: accepted condoms  Other Topics Concern   Not on file  Social History Narrative   Lives fiance [place] 8 years    Dog: Col[place] Storm y 3 years      Enjoys gardening, listening to music, just relaxing.      Diet:  Well balanced, but does like to eat sweets.   Caffeine: Does not drink much tea or sodas, does not drink coffee, most drinks Gatorade.    Water: 3-5 bottles of water daily      Wears seat belt   Does not wear sun protection   Smoke detectors   Occasional use of phone while driving   Social Determinants of Health   Financial Resource Strain: Low Risk  (09/28/2018)   Overall Financial Resource Strain (CARDIA)    Difficulty of Paying Living Expenses: Not hard at all  Food Insecurity: No Food Insecurity (09/28/2018)   Hunger Vital Sign    Worried About Running Out of Food in the Last Year: Never true    Ran Out of Food in the Last Year: Never true  Transportation Needs: No Transportation Needs (09/28/2018)   PRAPARE - Administrator, Civil Service (Medical): No    Lack of Transportation (Non-Medical): No  Physical Activity: Insufficiently Active (09/28/2018)   Exercise Vital Sign    Days of Exercise per Week: 1 day    Minutes of Exercise per Session: 30 min  Stress: Stress Concern Present (09/28/2018)   [employer] of Occupational  Health - Occupational Stress Questionnaire    Feeling of Stress : To some extent  Social Connections: Moderately Isolated (09/28/2018)   Social Connection and Isolation Panel [NHANES]    Frequency of Communication with Friends and Family: Once a week    Frequency of Social Gatherings with Friends and Family: More than three times a week    Attends Religious Services: Never    Database administrator or Organizations: No    Attends Banker Meetings: Never    Marital Status: Never married  Intimate Partner Violence: Not At Risk (09/28/2018)   Humiliation, Afraid, Rape, and Kick questionnaire    Fear of Current or Ex-Partner: No    Emotionally Abused: No    Physically Abused: No    Sexually Abused: No    Outpatient Medications Prior to Visit  Medication Sig Dispense Refill   emtricitabine-tenofovir AF (DESCOVY) 200-25 MG tablet Take 1  tablet by mouth daily. 30 tablet 2   ondansetron (ZOFRAN) 4 MG tablet Take 1 tablet (4 mg total) by mouth every 6 (six) hours. (Patient not taking: Reported on 10/01/2022) 12 tablet 0   No facility-administered medications prior to visit.    No Known Allergies  ROS Review of Systems  Constitutional:  Negative for chills and fever.  HENT:  Negative for congestion and sore throat.   Eyes:  Negative for pain and discharge.  Respiratory:  Negative for cough and shortness of breath.   Cardiovascular:  Negative for chest pain and palpitations.  Gastrointestinal:  Negative for constipation, diarrhea, nausea and vomiting.  Endocrine: Negative for polydipsia and polyuria.  Genitourinary:  Negative for dysuria and hematuria.  Musculoskeletal:  Negative for neck pain and neck stiffness.  Skin:  Positive for rash.  Neurological:  Negative for dizziness, weakness, numbness and headaches.  Psychiatric/Behavioral:  Negative for agitation, behavioral problems, decreased concentration, dysphoric mood, sleep disturbance and suicidal ideas.       Objective:    Physical Exam Vitals reviewed.  Constitutional:      General: He is not in acute distress.    Appearance: He is not diaphoretic.  HENT:     Head: Normocephalic and atraumatic.     Nose: Nose normal.     Mouth/Throat:     Mouth: Mucous membranes are moist.  Eyes:     General: No scleral icterus.    Extraocular Movements: Extraocular movements intact.  Cardiovascular:     Rate and Rhythm: Normal rate and regular rhythm.     Pulses: Normal pulses.     Heart sounds: Normal heart sounds. No murmur heard. Pulmonary:     Breath sounds: Normal breath sounds. No wheezing or rales.  Abdominal:     Palpations: Abdomen is soft.     Tenderness: There is no abdominal tenderness.  Musculoskeletal:     Cervical back: Neck supple. No tenderness.     Right lower leg: No edema.     Left lower leg: No edema.  Skin:    General: Skin is warm.      Findings: Lesion (Scrotal area lesion, about 1 cm in diameter) present.  Neurological:     General: No focal deficit present.     Mental Status: He is alert and oriented to person, place, and time.     Cranial Nerves: No cranial nerve deficit.     Sensory: No sensory deficit.     Motor: No weakness.  Psychiatric:        Mood and Affect: Mood normal.  Behavior: Behavior normal.     BP 134/70 (BP Location: Left Arm, Patient Position: Sitting, Cuff Size: Normal)   Pulse 78   Ht 5\' 5"  (1.651 m)   Wt 143 lb 9.6 oz (65.1 kg)   SpO2 97%   BMI 23.90 kg/m  Wt Readings from Last 3 Encounters:  10/01/22 143 lb 9.6 oz (65.1 kg)  01/01/22 152 lb (68.9 kg)  11/30/21 150 lb (68 kg)    Lab Results  Component Value Date   TSH 0.261 (L) 05/28/2021   Lab Results  Component Value Date   WBC 5.5 01/01/2022   HGB 13.8 01/01/2022   HCT 40.6 01/01/2022   MCV 82.9 01/01/2022   PLT 309 01/01/2022   Lab Results  Component Value Date   NA 139 01/01/2022   K 4.2 01/01/2022   CO2 28 01/01/2022   GLUCOSE 89 01/01/2022   BUN 11 01/01/2022   CREATININE 1.12 01/01/2022   BILITOT 0.8 07/05/2021   ALKPHOS 91 07/05/2021   AST 23 07/05/2021   ALT 41 07/05/2021   PROT 8.7 (H) 07/05/2021   ALBUMIN 4.5 07/05/2021   CALCIUM 10.1 01/01/2022   ANIONGAP 10 07/05/2021   EGFR 87 05/28/2021   Lab Results  Component Value Date   CHOL 165 05/28/2021   Lab Results  Component Value Date   HDL 50 05/28/2021   Lab Results  Component Value Date   LDLCALC 101 (H) 05/28/2021   Lab Results  Component Value Date   TRIG 71 05/28/2021   Lab Results  Component Value Date   CHOLHDL 3.3 05/28/2021   Lab Results  Component Value Date   HGBA1C 5.8 (H) 05/28/2021      Assessment & Plan:   Problem List Items Addressed This Visit       Genitourinary   Genital lesion, male    Unclear etiology Check RPR Lotrisone cream prescribed as it could be intertrigo as well      Relevant  Medications   clotrimazole-betamethasone (LOTRISONE) cream   Other Relevant Orders   RPR w/reflex to TrepSure   CBC with Differential/Platelet     Other   Tobacco abuse    Has been trying cut down - states he smokes about 6-7 cigarette/day  Asked about quitting: confirms that he currently smokes cigarettes Advise to quit smoking: Educated about QUITTING to reduce the risk of cancer, cardio and cerebrovascular disease. Assess willingness: Unwilling to quit at this time, but is working on cutting back. Assist with counseling and pharmacotherapy: Counseled for 5 minutes and literature provided. Arrange for follow up: follow up in 3 months and continue to offer help.      Depression, major, single episode, moderate (HCC)    Flowsheet Row Office Visit from 07/09/2021 in [facility]  PHQ-9 Total Score 0     Resolved now Was on Wellbutrin      Relevant Orders   CBC with Differential/Platelet   CMP14+EGFR   Encounter for general adult medical examination with abnormal findings - Primary    Physical exam as documented. Fasting blood tests ordered.      Low TSH level    Lab Results  Component Value Date   TSH 0.261 (L) 05/28/2021  Asymptomatic Check TSH and free T4      Relevant Orders   TSH + free T4   Other Visit Diagnoses     Mixed hyperlipidemia       Relevant Orders   Lipid Profile  Meds ordered this encounter  Medications   clotrimazole-betamethasone (LOTRISONE) cream    Sig: Apply 1 Application topically daily.    Dispense:  30 g    Refill:  0     Follow-up: Return in about 1 year (around 10/01/2023) for Annual physical.    Anabel Halon, MD

## 2022-10-01 NOTE — Assessment & Plan Note (Signed)
Unclear etiology Check RPR Lotrisone cream prescribed as it could be intertrigo as well

## 2022-10-03 ENCOUNTER — Ambulatory Visit: Payer: 59

## 2022-10-03 LAB — LIPID PANEL
Chol/HDL Ratio: 3.3 ratio (ref 0.0–5.0)
Cholesterol, Total: 163 mg/dL (ref 100–199)
HDL: 50 mg/dL (ref >39)
LDL Chol Calc (NIH): 87 mg/dL (ref 0–99)
Triglycerides: 149 mg/dL (ref 0–149)
VLDL Cholesterol Cal: 26 mg/dL (ref 5–40)

## 2022-10-03 LAB — CMP14+EGFR
ALT: 16 IU/L (ref 0–44)
AST: 18 IU/L (ref 0–40)
Albumin/Globulin Ratio: 1.7
Albumin: 4.4 g/dL (ref 4.1–5.1)
Alkaline Phosphatase: 59 IU/L (ref 44–121)
BUN/Creatinine Ratio: 14 (ref 9–20)
BUN: 15 mg/dL (ref 6–20)
Bilirubin Total: 0.4 mg/dL (ref 0.0–1.2)
CO2: 24 mmol/L (ref 20–29)
Calcium: 9.7 mg/dL (ref 8.7–10.2)
Chloride: 100 mmol/L (ref 96–106)
Creatinine, Ser: 1.1 mg/dL (ref 0.76–1.27)
Globulin, Total: 2.6 g/dL (ref 1.5–4.5)
Glucose: 81 mg/dL (ref 70–99)
Potassium: 4.1 mmol/L (ref 3.5–5.2)
Sodium: 141 mmol/L (ref 134–144)
Total Protein: 7 g/dL (ref 6.0–8.5)
eGFR: 92 mL/min/{1.73_m2} (ref >59)

## 2022-10-03 LAB — CBC WITH DIFFERENTIAL/PLATELET
Basophils Absolute: 0.1 10*3/uL (ref 0.0–0.2)
Basos: 1 %
EOS (ABSOLUTE): 0.4 10*3/uL (ref 0.0–0.4)
Eos: 6 %
Hematocrit: 40.8 % (ref 37.5–51.0)
Hemoglobin: 13.6 g/dL (ref 13.0–17.7)
Immature Grans (Abs): 0 10*3/uL (ref 0.0–0.1)
Immature Granulocytes: 0 %
Lymphocytes Absolute: 3.5 10*3/uL — ABNORMAL HIGH (ref 0.7–3.1)
Lymphs: 54 %
MCH: 28.2 pg (ref 26.6–33.0)
MCHC: 33.3 g/dL (ref 31.5–35.7)
MCV: 85 fL (ref 79–97)
Monocytes Absolute: 0.3 10*3/uL (ref 0.1–0.9)
Monocytes: 4 %
Neutrophils Absolute: 2.2 10*3/uL (ref 1.4–7.0)
Neutrophils: 35 %
Platelets: 356 10*3/uL (ref 150–450)
RBC: 4.83 x10E6/uL (ref 4.14–5.80)
RDW: 13.5 % (ref 11.6–15.4)
WBC: 6.4 10*3/uL (ref 3.4–10.8)

## 2022-10-03 LAB — TREPONEMAL ANTIBODIES, TPPA: Treponemal Antibodies, TPPA: REACTIVE — AB

## 2022-10-03 LAB — RPR W/REFLEX TO TREPSURE: RPR: NONREACTIVE

## 2022-10-03 LAB — TSH+FREE T4
Free T4: 1.43 ng/dL (ref 0.82–1.77)
TSH: 2.34 u[IU]/mL (ref 0.450–4.500)

## 2022-10-07 ENCOUNTER — Ambulatory Visit (INDEPENDENT_AMBULATORY_CARE_PROVIDER_SITE_OTHER): Payer: 59

## 2022-10-07 ENCOUNTER — Telehealth: Payer: Self-pay

## 2022-10-07 DIAGNOSIS — A539 Syphilis, unspecified: Secondary | ICD-10-CM

## 2022-10-07 MED ORDER — PENICILLIN G BENZATHINE 2400000 UNIT/4ML IM SUSY
2.4000 10*6.[IU] | PREFILLED_SYRINGE | Freq: Once | INTRAMUSCULAR | Status: AC
Start: 2022-10-07 — End: 2022-10-07
  Administered 2022-10-07: 2400000 [IU] via INTRAMUSCULAR

## 2022-10-07 NOTE — Telephone Encounter (Signed)
I gave patient penicillin injection, I was suppose to hold patient for after to see if he had a reaction. It slipped my mind to hold patient due to rooming two doctors. I called patient and informed him I was suppose to hold him for 15 mins and he said he understood but he was on his way to work and felt fine. I told him to let us know if anything changed

## 2022-11-20 ENCOUNTER — Ambulatory Visit (INDEPENDENT_AMBULATORY_CARE_PROVIDER_SITE_OTHER): Payer: 59 | Admitting: Pharmacist

## 2022-11-20 ENCOUNTER — Other Ambulatory Visit: Payer: Self-pay

## 2022-11-20 ENCOUNTER — Other Ambulatory Visit (HOSPITAL_COMMUNITY)
Admission: RE | Admit: 2022-11-20 | Discharge: 2022-11-20 | Disposition: A | Discharge status: Home / Self Care | Payer: 59 | Source: Ambulatory Visit | Attending: Internal Medicine | Admitting: Internal Medicine

## 2022-11-20 DIAGNOSIS — Z79899 Other long term (current) drug therapy: Secondary | ICD-10-CM

## 2022-11-20 DIAGNOSIS — Z113 Encounter for screening for infections with a predominantly sexual mode of transmission: Secondary | ICD-10-CM | POA: Diagnosis not present

## 2022-11-20 NOTE — Progress Notes (Signed)
Date:  11/20/2022   HPI: Mario Nguyen is a 32 y.o. male who presents to the [facility] pharmacy clinic for HIV PrEP follow-up.  Insured   [x]    Uninsured  []    Patient Active Problem List   Diagnosis Date Noted   Low TSH level 10/01/2022   Genital lesion, male 10/01/2022   Encounter for general adult medical examination with abnormal findings 09/10/2021   Encounter for examination following treatment at hospital 07/09/2021   Syphilis 07/09/2021   LAD (lymphadenopathy), inguinal 07/09/2021   Gastroesophageal reflux disease without esophagitis 05/28/2021   Hemorrhoids 08/01/2020   Depression, major, single episode, moderate (HCC) 08/01/2020   Intertrigo 08/01/2020   Tobacco abuse 08/03/2013    Patient's Medications  New Prescriptions   No medications on file  Previous Medications   CLOTRIMAZOLE-BETAMETHASONE (LOTRISONE) CREAM    Apply 1 Application topically daily.   EMTRICITABINE-TENOFOVIR AF (DESCOVY) 200-25 MG TABLET    Take 1 tablet by mouth daily.   ONDANSETRON (ZOFRAN) 4 MG TABLET    Take 1 tablet (4 mg total) by mouth every 6 (six) hours.  Modified Medications   No medications on file  Discontinued Medications   No medications on file    Allergies: No Known Allergies  Past Medical History: Past Medical History:  Diagnosis Date   Encounter for hepatitis C screening test for low risk patient 06/28/2019    Social History: Social History   Socioeconomic History   Marital status: Single    Spouse name: Not on file   Number of children: Not on file   Years of education: 10   Highest education level: 10th grade  Occupational History   Not on file  Tobacco Use   Smoking status: Some Days    Current packs/day: 0.30    Average packs/day: 0.3 packs/day for 6.0 years (1.8 ttl pk-yrs)    Types: Cigarettes   Smokeless tobacco: Never   Tobacco comments:    States working on quitting  Vaping Use   Vaping status: Never Used  Substance and Sexual Activity    Alcohol use: Yes    Comment: occasional   Drug use: Yes    Frequency: 7.0 times per week    Types: Marijuana   Sexual activity: Yes    Partners: Male    Birth control/protection: None    Comment: accepted condoms  Other Topics Concern   Not on file  Social History Narrative   Lives fiance [place] 8 years    Dog: [place] Storm y 3 years      Enjoys gardening, listening to music, just relaxing.      Diet: Well balanced, but does like to eat sweets.   Caffeine: Does not drink much tea or sodas, does not drink coffee, most drinks Gatorade.    Water: 3-5 bottles of water daily      Wears seat belt   Does not wear sun protection   Smoke detectors   Occasional use of phone while driving   Social Determinants of Health   Financial Resource Strain: Low Risk  (09/28/2018)   Overall Financial Resource Strain (CARDIA)    Difficulty of Paying Living Expenses: Not hard at all  Food Insecurity: No Food Insecurity (09/28/2018)   Hunger Vital Sign    Worried About Running Out of Food in the Last Year: Never true    Ran Out of Food in the Last Year: Never true  Transportation Needs: No Transportation Needs (09/28/2018)   PRAPARE - Transportation  Lack of Transportation (Medical): No    Lack of Transportation (Non-Medical): No  Physical Activity: Insufficiently Active (09/28/2018)   Exercise Vital Sign    Days of Exercise per Week: 1 day    Minutes of Exercise per Session: 30 min  Stress: Stress Concern Present (09/28/2018)   [employer] of Occupational Health - Occupational Stress Questionnaire    Feeling of Stress : To some extent  Social Connections: Moderately Isolated (09/28/2018)   Social Connection and Isolation Panel [NHANES]    Frequency of Communication with Friends and Family: Once a week    Frequency of Social Gatherings with Friends and Family: More than three times a week    Attends Religious Services: Never    Database administrator or Organizations: No    Attends Tax inspector Meetings: Never    Marital Status: Never married       04/08/2022    3:36 PM  CHL HIV PREP FLOWSHEET RESULTS  Insurance Status Uninsured  Gender at birth Male  Gender identity cis-Male  Risk for HIV Hx of STI  Sex Partners Men only  # sex partners past 3-6 mos 2  Sex activity preferences Insertive and receptive;Oral  Condom use Yes  % condom use 50  Treated for STI? No  PrEP Eligibility Yes    Labs:  SCr: Lab Results  Component Value Date   CREATININE 1.10 10/01/2022   CREATININE 1.12 01/01/2022   CREATININE 1.06 07/05/2021   CREATININE 1.17 05/28/2021   CREATININE 1.02 08/01/2020   HIV Lab Results  Component Value Date   HIV NON-REACTIVE 08/20/2022   HIV NON-REACTIVE 07/04/2022   HIV NON-REACTIVE 04/08/2022   HIV NON-REACTIVE 08/14/2021   HIV Non Reactive 07/23/2021   Hepatitis B Lab Results  Component Value Date   HEPBSAG NON-REACTIVE 09/26/2021   HEPBCAB NON-REACTIVE 09/26/2021   Hepatitis C Lab Results  Component Value Date   HEPCAB NON-REACTIVE 09/26/2021   Hepatitis A Lab Results  Component Value Date   HAV NON-REACTIVE 08/14/2021   RPR and STI Lab Results  Component Value Date   LABRPR NON-REACTIVE 07/04/2022   LABRPR NON-REACTIVE 04/08/2022   LABRPR NON-REACTIVE 01/01/2022   LABRPR NON-REACTIVE 09/26/2021   LABRPR Reactive (A) 07/23/2021    STI Results GC GC CT CT  08/20/2022  9:25 AM Negative    Negative   Negative    Negative    04/08/2022  3:49 PM Negative    Negative    Negative   Negative    Negative    Negative    01/01/2022  4:40 PM Negative   Negative    01/01/2022  4:16 PM Negative    Negative   Negative    Negative    09/26/2021 11:32 AM Negative    Negative    Negative   Negative    Negative    Negative    08/14/2021 10:18 AM Negative    Negative    Negative   Negative    Negative    Negative    07/05/2021 12:58 AM Negative   Negative    06/28/2019  3:01 PM Negative   Negative    08/29/2015 10:27  AM  NOT DETECTED   NOT DETECTED     Assessment: Awesome presents to clinic today for PrEP follow-up. He states he has missed ~2 weeks of his medication throughout the last 3 months. He only filled one 30-day supply in May stating he had leftover bottles from the last few months  given his non-adherence earlier this year.  Will check HIV antibody and send in refill once it results negative.  Last STI screening was in May and was negative. Screened patient for acute HIV symptoms such as fatigue, muscle aches, rash, sore throat, lymphadenopathy, headache, night sweats, nausea/vomiting/diarrhea, and fever. He was treated empirically for primary syphilis in June through family medicine because he had a genital rash/lesion. His doctor also prescribed Lotrisone cream stating this could represent intertrigo. Mabel's RPR returned negative, but he states his symptoms have resolved now. Has been with new partners since then, so will recheck RPR along with urine/oral/rectal cytologies today. Denied any further concern for STI symptoms today; politely declines condoms.   Deferring vaccines today as patient is recovering from common cold. Due for HAV, COVID, and HPV.   Plan: - Check HIV antibody, RPR, and urine/oral/rectal cytologies - If HIV antibody, refill Descovy - Follow-up with me on 10/29    Margarite Gouge, PharmD, CPP, BCIDP, AAHIVP Clinical Pharmacist Practitioner Infectious Diseases Clinical Pharmacist [facility] 11/20/2022, 9:50 AM

## 2022-11-21 ENCOUNTER — Other Ambulatory Visit (HOSPITAL_COMMUNITY): Payer: Self-pay

## 2022-11-21 ENCOUNTER — Other Ambulatory Visit: Payer: Self-pay

## 2022-11-21 ENCOUNTER — Other Ambulatory Visit: Payer: Self-pay | Admitting: Pharmacist

## 2022-11-21 DIAGNOSIS — Z79899 Other long term (current) drug therapy: Secondary | ICD-10-CM

## 2022-11-21 MED ORDER — DESCOVY 200-25 MG PO TABS
1.0000 | ORAL_TABLET | Freq: Every day | ORAL | 0 refills | Status: AC
Start: 2022-11-21 — End: ?
  Filled 2022-11-21 – 2022-12-09 (×2): qty 30, 30d supply, fill #0

## 2022-11-21 NOTE — Progress Notes (Signed)
Patient has surplus of bottles at this time due to historical non-adherence. Only sending 30-day supply.  Margarite Gouge, PharmD, CPP, BCIDP, AAHIVP Clinical Pharmacist Practitioner Infectious Diseases Clinical Pharmacist [facility]

## 2022-12-03 ENCOUNTER — Ambulatory Visit
Admission: EM | Admit: 2022-12-03 | Discharge: 2022-12-03 | Disposition: A | Discharge status: Home / Self Care | Payer: 59 | Attending: Family Medicine | Admitting: Family Medicine

## 2022-12-03 DIAGNOSIS — K625 Hemorrhage of anus and rectum: Secondary | ICD-10-CM | POA: Diagnosis not present

## 2022-12-03 DIAGNOSIS — R0981 Nasal congestion: Secondary | ICD-10-CM | POA: Diagnosis not present

## 2022-12-03 DIAGNOSIS — R197 Diarrhea, unspecified: Secondary | ICD-10-CM | POA: Diagnosis not present

## 2022-12-03 DIAGNOSIS — F17210 Nicotine dependence, cigarettes, uncomplicated: Secondary | ICD-10-CM | POA: Insufficient documentation

## 2022-12-03 DIAGNOSIS — Z1152 Encounter for screening for COVID-19: Secondary | ICD-10-CM | POA: Diagnosis not present

## 2022-12-03 LAB — POCT URINALYSIS DIP (MANUAL ENTRY)
Bilirubin, UA: NEGATIVE
Blood, UA: NEGATIVE
Glucose, UA: NEGATIVE mg/dL
Ketones, POC UA: NEGATIVE mg/dL
Leukocytes, UA: NEGATIVE
Nitrite, UA: NEGATIVE
Protein Ur, POC: NEGATIVE mg/dL
Spec Grav, UA: 1.025
Urobilinogen, UA: 0.2 U/dL
pH, UA: 6.5

## 2022-12-03 MED ORDER — FLUTICASONE PROPIONATE 50 MCG/ACT NA SUSP: 1.0000 | Freq: Two times a day (BID) | NASAL | 2 refills | Status: AC

## 2022-12-03 MED ORDER — HYDROCORTISONE ACETATE 25 MG RE SUPP: 25.0000 mg | Freq: Two times a day (BID) | RECTAL | 0 refills | Status: AC | PRN

## 2022-12-03 NOTE — ED Triage Notes (Signed)
Pt c/o diarrhea, blood in urine abdominal pain x 3 days

## 2022-12-03 NOTE — Discharge Instructions (Signed)
Your COVID test should be available tomorrow.  I have sent in Flonase to help with your nasal congestion and you may take over-the-counter cold and congestion medication as well.  Regarding your bleeding, suspected to be from some hemorrhoids from the diarrhea that you have been having.  I have sent over Anusol suppositories to help with inflammation and you may do Epsom salt soaks, avoid straining with bowel movements and keep bowel movements regular.  Follow-up for worsening symptoms.

## 2022-12-03 NOTE — ED Provider Notes (Signed)
[facility]    CSN: [id number] Arrival date & time: 12/03/22  1118      History   Chief Complaint No chief complaint on file.   HPI Mario Nguyen is a 32 y.o. male.   Patient presenting today with 3-day history of diarrhea and now some bright red blood after wiping from a bowel movement.  Denies significant abdominal pain, urinary symptoms, fever, chills, weakness, new foods or medications.  So far not tried anything over-the-counter for symptoms.  Does have a history of hemorrhoids.  Also notes new nasal congestion, wanting to make sure it is not a cold.  Not tried anything for the symptoms either.    Past Medical History:  Diagnosis Date   Encounter for hepatitis C screening test for low risk patient 06/28/2019    Patient Active Problem List   Diagnosis Date Noted   Low TSH level 10/01/2022   Genital lesion, male 10/01/2022   Encounter for general adult medical examination with abnormal findings 09/10/2021   Encounter for examination following treatment at hospital 07/09/2021   Syphilis 07/09/2021   LAD (lymphadenopathy), inguinal 07/09/2021   Gastroesophageal reflux disease without esophagitis 05/28/2021   Hemorrhoids 08/01/2020   Depression, major, single episode, moderate (HCC) 08/01/2020   Intertrigo 08/01/2020   Tobacco abuse 08/03/2013    History reviewed. No pertinent surgical history.     Home Medications    Prior to Admission medications   Medication Sig Start Date End Date Taking? Authorizing Provider  fluticasone (FLONASE) 50 MCG/ACT nasal spray Place 1 spray into both nostrils 2 (two) times daily. 12/03/22  Yes Particia Nearing, PA-C  hydrocortisone (ANUSOL-HC) 25 MG suppository Place 1 suppository (25 mg total) rectally 2 (two) times daily as needed for hemorrhoids or anal itching. 12/03/22  Yes Particia Nearing, PA-C  clotrimazole-betamethasone (LOTRISONE) cream Apply 1 Application topically daily. 10/01/22   Anabel Halon, MD  emtricitabine-tenofovir AF (DESCOVY) 200-25 MG tablet Take 1 tablet by mouth daily. 11/21/22   Jennette Kettle, RPH-CPP  ondansetron (ZOFRAN) 4 MG tablet Take 1 tablet (4 mg total) by mouth every 6 (six) hours. Patient not taking: Reported on 10/01/2022 07/02/22   Barbette Merino, NP    Family History Family History  Problem Relation Age of Onset   Hypertension Mother    Learning disabilities Maternal Grandmother    Stroke Maternal Grandfather     Social History Social History   Tobacco Use   Smoking status: Some Days    Current packs/day: 0.30    Average packs/day: 0.3 packs/day for 6.0 years (1.8 ttl pk-yrs)    Types: Cigarettes   Smokeless tobacco: Never   Tobacco comments:    States working on quitting  Vaping Use   Vaping status: Never Used  Substance Use Topics   Alcohol use: Yes    Comment: occasional   Drug use: Yes    Frequency: 7.0 times per week    Types: Marijuana     Allergies   Patient has no known allergies.   Review of Systems Review of Systems Per HPI  Physical Exam Triage Vital Signs ED Triage Vitals  Encounter Vitals Group     BP 12/03/22 1125 123/77     Systolic BP Percentile --      Diastolic BP Percentile --      Pulse Rate 12/03/22 1125 83     Resp 12/03/22 1125 12     Temp 12/03/22 1125 98.3 F (36.8 C)  Temp Source 12/03/22 1125 Oral     SpO2 12/03/22 1125 98 %     Weight --      Height --      Head Circumference --      Peak Flow --      Pain Score 12/03/22 1128 3     Pain Loc --      Pain Education --      Exclude from Growth Chart --    No data found.  Updated Vital Signs BP 123/77 (BP Location: Right Arm)   Pulse 83   Temp 98.3 F (36.8 C) (Oral)   Resp 12   SpO2 98%   Visual Acuity Right Eye Distance:   Left Eye Distance:   Bilateral Distance:    Right Eye Near:   Left Eye Near:    Bilateral Near:     Physical Exam Vitals and nursing note reviewed.  Constitutional:      Appearance: He is  well-developed.  HENT:     Head: Atraumatic.     Nose: Rhinorrhea present.     Mouth/Throat:     Mouth: Mucous membranes are moist.     Pharynx: Oropharynx is clear. No oropharyngeal exudate.  Eyes:     Conjunctiva/sclera: Conjunctivae normal.     Pupils: Pupils are equal, round, and reactive to light.  Cardiovascular:     Rate and Rhythm: Normal rate and regular rhythm.  Pulmonary:     Effort: Pulmonary effort is normal. No respiratory distress.     Breath sounds: No wheezing or rales.  Abdominal:     General: Bowel sounds are normal. There is no distension.     Palpations: Abdomen is soft.     Tenderness: There is no abdominal tenderness. There is no right CVA tenderness, left CVA tenderness or guarding.  Genitourinary:    Comments: Anorectal exam declined by patient Musculoskeletal:        General: Normal range of motion.     Cervical back: Normal range of motion and neck supple.  Lymphadenopathy:     Cervical: No cervical adenopathy.  Skin:    General: Skin is warm and dry.  Neurological:     Mental Status: He is alert and oriented to person, place, and time.  Psychiatric:        Behavior: Behavior normal.      UC Treatments / Results  Labs (all labs ordered are listed, but only abnormal results are displayed) Labs Reviewed  SARS CORONAVIRUS 2 (TAT 6-24 HRS)  POCT URINALYSIS DIP (MANUAL ENTRY)    EKG   Radiology No results found.  Procedures Procedures (including critical care time)  Medications Ordered in UC Medications - No data to display  Initial Impression / Assessment and Plan / UC Course  I have reviewed the triage vital signs and the nursing notes.  Pertinent labs & imaging results that were available during my care of the patient were reviewed by me and considered in my medical decision making (see chart for details).     Suspect inflamed hemorrhoids causing the bleeding, related to the recent diarrhea.  Treat with Anusol suppositories, sitz  bath's, good bowel regimen.  Follow-up for worsening symptoms.  COVID testing pending given new congestion.  Discussed Flonase, supportive over-the-counter medications and home care.  Urinalysis was done in triage as he initially said blood in his urine, this was normal.  Patient now denies blood in his urine and states the blood was with wiping.  Final Clinical  Impressions(s) / UC Diagnoses   Final diagnoses:  Nasal congestion  Diarrhea, unspecified type  Rectal bleeding     Discharge Instructions      Your COVID test should be available tomorrow.  I have sent in Flonase to help with your nasal congestion and you may take over-the-counter cold and congestion medication as well.  Regarding your bleeding, suspected to be from some hemorrhoids from the diarrhea that you have been having.  I have sent over Anusol suppositories to help with inflammation and you may do Epsom salt soaks, avoid straining with bowel movements and keep bowel movements regular.  Follow-up for worsening symptoms.    ED Prescriptions     Medication Sig Dispense Auth. Provider   hydrocortisone (ANUSOL-HC) 25 MG suppository Place 1 suppository (25 mg total) rectally 2 (two) times daily as needed for hemorrhoids or anal itching. 20 suppository Particia Nearing, PA-C   fluticasone [facility]) 50 MCG/ACT nasal spray Place 1 spray into both nostrils 2 (two) times daily. 16 g Particia Nearing, [place]      PDMP not reviewed this encounter.   Particia Nearing, [place] 12/03/22 1948

## 2022-12-04 LAB — SARS CORONAVIRUS 2 (TAT 6-24 HRS): SARS Coronavirus 2: NEGATIVE

## 2022-12-08 ENCOUNTER — Other Ambulatory Visit (HOSPITAL_COMMUNITY): Payer: Self-pay

## 2022-12-09 ENCOUNTER — Other Ambulatory Visit: Payer: Self-pay

## 2022-12-09 ENCOUNTER — Other Ambulatory Visit (HOSPITAL_COMMUNITY): Payer: Self-pay

## 2022-12-12 ENCOUNTER — Other Ambulatory Visit (HOSPITAL_COMMUNITY): Payer: Self-pay

## 2022-12-15 ENCOUNTER — Other Ambulatory Visit (HOSPITAL_COMMUNITY): Payer: Self-pay

## 2023-01-08 ENCOUNTER — Other Ambulatory Visit (HOSPITAL_COMMUNITY): Payer: Self-pay

## 2023-01-26 ENCOUNTER — Other Ambulatory Visit: Payer: Self-pay

## 2023-02-16 NOTE — Progress Notes (Unsigned)
HPI: Mario Nguyen is a 32 y.o. male who presents to the [facility] pharmacy clinic for HIV PrEP follow-up.  Insured   [x]    Uninsured  []    Patient Active Problem List   Diagnosis Date Noted   Low TSH level 10/01/2022   Genital lesion, male 10/01/2022   Encounter for general adult medical examination with abnormal findings 09/10/2021   Encounter for examination following treatment at hospital 07/09/2021   Syphilis 07/09/2021   LAD (lymphadenopathy), inguinal 07/09/2021   Gastroesophageal reflux disease without esophagitis 05/28/2021   Hemorrhoids 08/01/2020   Depression, major, single episode, moderate (HCC) 08/01/2020   Intertrigo 08/01/2020   Tobacco abuse 08/03/2013    Patient's Medications  New Prescriptions   No medications on file  Previous Medications   CLOTRIMAZOLE-BETAMETHASONE (LOTRISONE) CREAM    Apply 1 Application topically daily.   EMTRICITABINE-TENOFOVIR AF (DESCOVY) 200-25 MG TABLET    Take 1 tablet by mouth daily.   FLUTICASONE (FLONASE) 50 MCG/ACT NASAL SPRAY    Place 1 spray into both nostrils 2 (two) times daily.   HYDROCORTISONE (ANUSOL-HC) 25 MG SUPPOSITORY    Place 1 suppository (25 mg total) rectally 2 (two) times daily as needed for hemorrhoids or anal itching.   ONDANSETRON (ZOFRAN) 4 MG TABLET    Take 1 tablet (4 mg total) by mouth every 6 (six) hours.  Modified Medications   No medications on file  Discontinued Medications   No medications on file       04/08/2022    3:36 PM  [facility] HIV PREP FLOWSHEET RESULTS  Insurance Status Uninsured  Gender at birth Male  Gender identity cis-Male  Risk for HIV Hx of STI  Sex Partners Men only  # sex partners past 3-6 mos 2  Sex activity preferences Insertive and receptive;Oral  Condom use Yes  % condom use 50  Treated for STI? No  PrEP Eligibility Yes    Labs:  SCr: Lab Results  Component Value Date   CREATININE 1.10 10/01/2022   CREATININE 1.12 01/01/2022   CREATININE 1.06 07/05/2021    CREATININE 1.17 05/28/2021   CREATININE 1.02 08/01/2020   HIV Lab Results  Component Value Date   HIV NON-REACTIVE 11/20/2022   HIV NON-REACTIVE 08/20/2022   HIV NON-REACTIVE 07/04/2022   HIV NON-REACTIVE 04/08/2022   HIV NON-REACTIVE 08/14/2021   Hepatitis B Lab Results  Component Value Date   HEPBSAG NON-REACTIVE 09/26/2021   HEPBCAB NON-REACTIVE 09/26/2021   Hepatitis C Lab Results  Component Value Date   HEPCAB NON-REACTIVE 09/26/2021   Hepatitis A Lab Results  Component Value Date   HAV NON-REACTIVE 08/14/2021   RPR and STI Lab Results  Component Value Date   LABRPR NON-REACTIVE 11/20/2022   LABRPR NON-REACTIVE 07/04/2022   LABRPR NON-REACTIVE 04/08/2022   LABRPR NON-REACTIVE 01/01/2022   LABRPR NON-REACTIVE 09/26/2021    STI Results GC GC CT CT  11/20/2022  9:04 AM Negative    Negative    Negative   Negative    Negative    Negative    08/20/2022  9:25 AM Negative    Negative   Negative    Negative    04/08/2022  3:49 PM Negative    Negative    Negative   Negative    Negative    Negative    01/01/2022  4:40 PM Negative   Negative    01/01/2022  4:16 PM Negative    Negative   Negative    Negative    09/26/2021  11:32 AM Negative    Negative    Negative   Negative    Negative    Negative    08/14/2021 10:18 AM Negative    Negative    Negative   Negative    Negative    Negative    07/05/2021 12:58 AM Negative   Negative    06/28/2019  3:01 PM Negative   Negative    08/29/2015 10:27 AM  NOT DETECTED   NOT DETECTED     Assessment: Tasheem presents today for PrEP follow up. He is currently prescribed Descovy once daily. He reports sporadic dosing of Descovy. A review of fill history reveals last fill dates: 12/15/22, 08/22/22. Discussed with Mario Nguyen his barriers to adherence. He reports that he's having trouble with remembering to take a tablet every day. Denies concerns with adverse effects, or problems obtaining the medication. He reports having 1.5  months supply on hand.   Expressed the importance of adherence with regards to protection from HIV. If he continues to take Descovy in this manner, he will not be protected from HIV should he be exposed. He endorses understanding of this. This also leads to increased risk of ART resistance if he were to contract HIV. He has had ~5 partners in the last 6 months. He reports that he would still like to continue PrEP with Descovy. I have discussed Apretude with him, and believe this would be a great way to address pill burden. He is adverse to needles, but will think about this for next visit.  Some concerns for STI in June due to genital rash/lesion. He has no current concerns or known exposure. Mario Nguyen is amendable to STI testing today. Due for HAV, COVID, influenza, and HPV. Mario Nguyen politely declines these today.  Plan: - Check HIV antibody, RPR, and urine/oral/rectal cytologies - Will hold off on refills for now, Mario Nguyen will reach out via MyChart when he needs refills. He should need refills in ~1.5 months - Follow up appointment in 3 months on 04/29/23 with Drucie Opitz, PharmD PGY-2 Infectious Diseases Pharmacy Resident 02/17/2023 4:33 PM

## 2023-02-17 ENCOUNTER — Ambulatory Visit (INDEPENDENT_AMBULATORY_CARE_PROVIDER_SITE_OTHER): Payer: 59 | Admitting: Pharmacist

## 2023-02-17 ENCOUNTER — Other Ambulatory Visit (HOSPITAL_COMMUNITY)
Admission: RE | Admit: 2023-02-17 | Discharge: 2023-02-17 | Disposition: A | Discharge status: Home / Self Care | Payer: 59 | Source: Ambulatory Visit | Attending: Internal Medicine | Admitting: Internal Medicine

## 2023-02-17 ENCOUNTER — Other Ambulatory Visit: Payer: Self-pay

## 2023-02-17 DIAGNOSIS — Z2981 Encounter for HIV pre-exposure prophylaxis: Secondary | ICD-10-CM

## 2023-02-18 ENCOUNTER — Other Ambulatory Visit: Payer: Self-pay

## 2023-02-18 ENCOUNTER — Other Ambulatory Visit: Payer: Self-pay | Admitting: Pharmacist

## 2023-02-18 LAB — HIV ANTIBODY (ROUTINE TESTING W REFLEX): HIV 1&2 Ab, 4th Generation: NONREACTIVE

## 2023-02-18 LAB — RPR: RPR Ser Ql: NONREACTIVE

## 2023-02-18 NOTE — Progress Notes (Signed)
Specialty Pharmacy Ongoing Clinical Assessment Note  Mario Nguyen is a 32 y.o. male who is being followed by the specialty pharmacy service for RxSp HIV PrEP   Patient's specialty medication(s) reviewed today: Emtricitabine-Tenofovir Af   Missed doses in the last 4 weeks: More than 5   Patient/Caregiver did not have any additional questions or concerns.   Therapeutic benefit summary: Unable to assess   Adverse events/side effects summary: Unable to assess   Patient's therapy is appropriate to: Continue    Goals Addressed             This Visit's Progress    Comply with lab assessments       Patient is on track. Patient will maintain adherence      Maintain optimal adherence to therapy       Patient is not on track and no change. Patient will work on increased adherence      Practice safe sex       Patient is not on track and improving. Patient will maintain adherence         Follow up:  6 months  Jennette Kettle Specialty Pharmacist

## 2023-02-19 ENCOUNTER — Other Ambulatory Visit: Payer: Self-pay

## 2023-02-19 ENCOUNTER — Other Ambulatory Visit (HOSPITAL_COMMUNITY): Payer: Self-pay

## 2023-02-19 LAB — CYTOLOGY, (ORAL, ANAL, URETHRAL) ANCILLARY ONLY
Chlamydia: NEGATIVE
Chlamydia: NEGATIVE
Comment: NEGATIVE
Comment: NEGATIVE
Comment: NORMAL
Comment: NORMAL
Neisseria Gonorrhea: NEGATIVE
Neisseria Gonorrhea: NEGATIVE

## 2023-02-19 LAB — URINE CYTOLOGY ANCILLARY ONLY
Chlamydia: NEGATIVE
Comment: NEGATIVE
Comment: NORMAL
Neisseria Gonorrhea: NEGATIVE

## 2023-02-20 ENCOUNTER — Other Ambulatory Visit (HOSPITAL_COMMUNITY): Payer: Self-pay

## 2023-02-23 ENCOUNTER — Other Ambulatory Visit: Payer: Self-pay

## 2023-02-24 ENCOUNTER — Other Ambulatory Visit: Payer: Self-pay

## 2023-03-26 IMAGING — CT CT ABD-PELV W/ CM
2 of 4 series · 15 of 46 positions shown, 17 images · IV contrast (agent unspecified)
Comparison: None.

CLINICAL DATA: Lymphadenopathy, groin mass

EXAM:
CT ABDOMEN AND PELVIS WITH CONTRAST
TECHNIQUE: Multidetector CT imaging of the abdomen and pelvis was performed
using the standard protocol following bolus administration of
intravenous contrast.

[Series 4: coronal st · coronal · 0.67mm/px · 3 of 98 slices shown]
[im 33/98  soft-tissue]
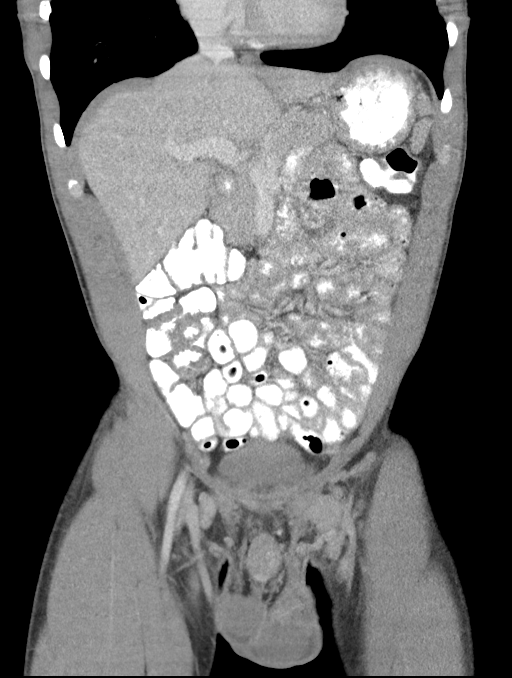
[im 44/98  soft-tissue]
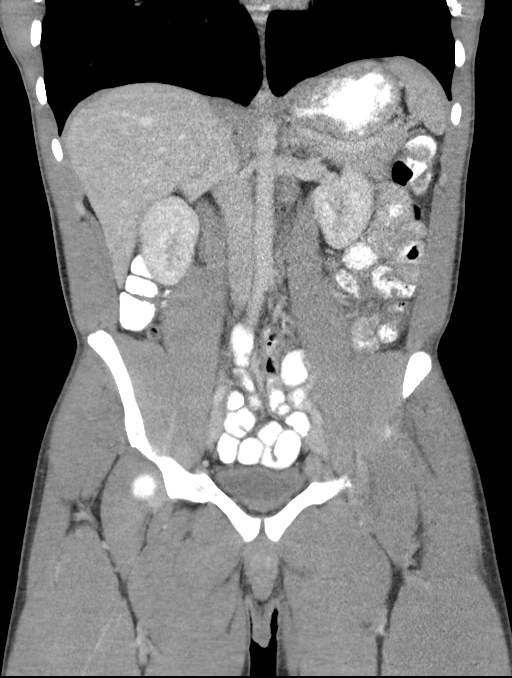
[im 54/98  soft-tissue]
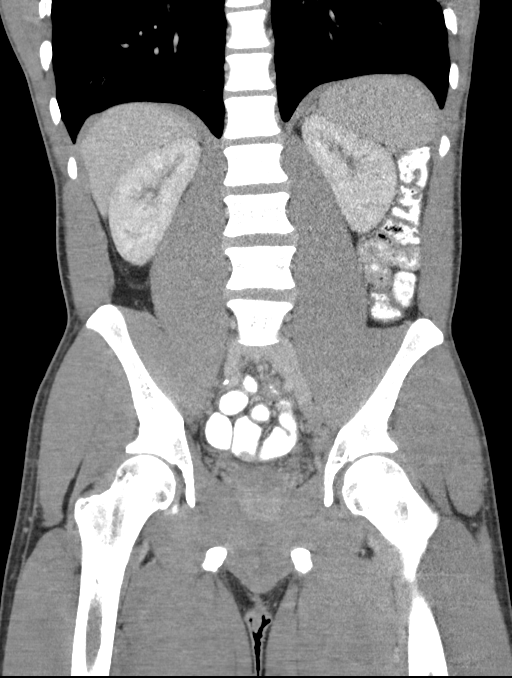

[Series 6: axial st · axial · 0.85mm/px · z∈[+852,+1272]mm · 12 of 92 slices shown, 14 images]
[im 4/92  soft-tissue]
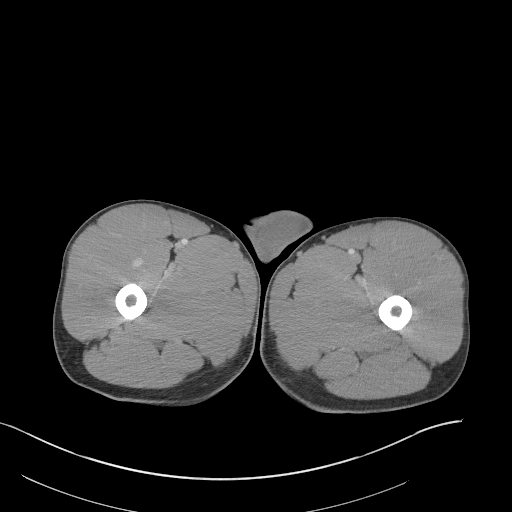
[im 4/92  bone]
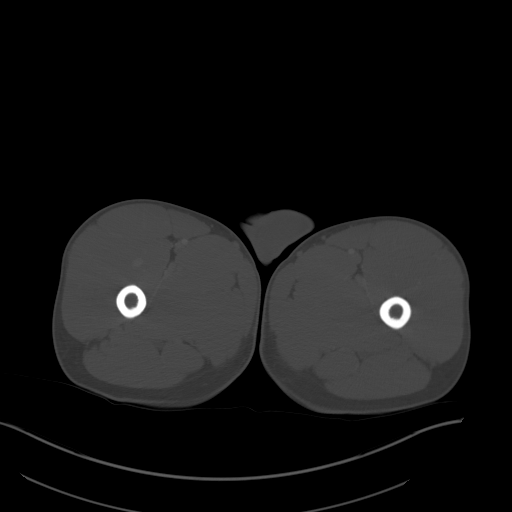
[im 12/92  soft-tissue]
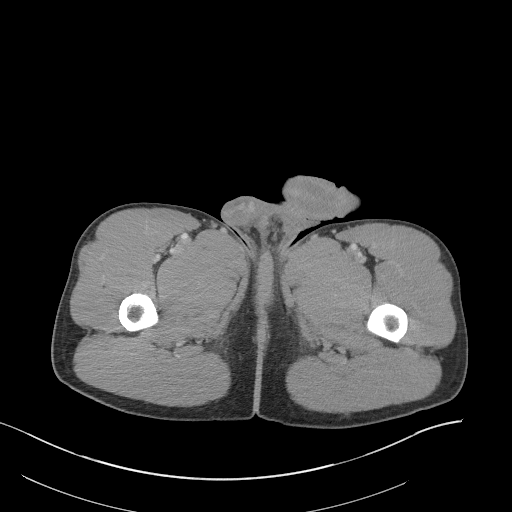
[im 19/92  soft-tissue]
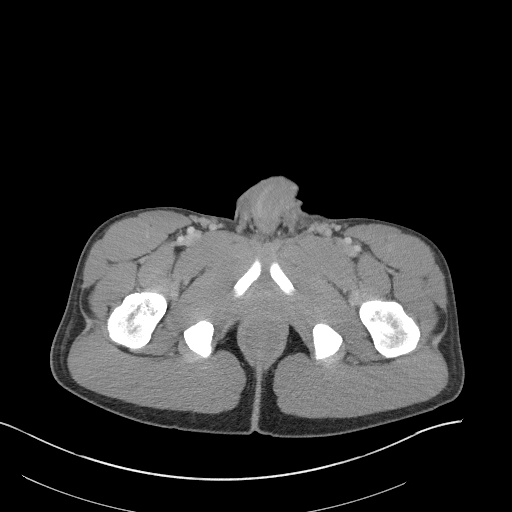
[im 27/92  soft-tissue]
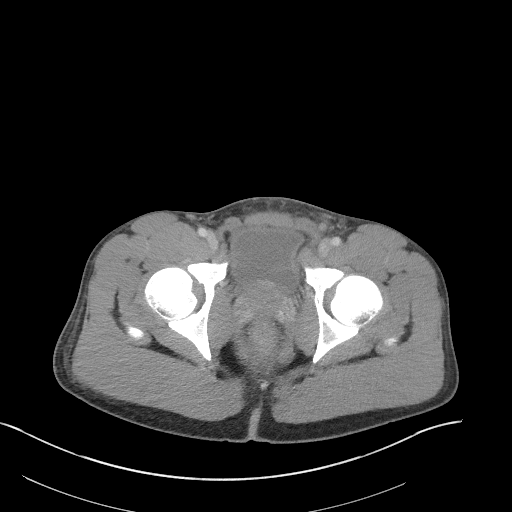
[im 35/92  soft-tissue]
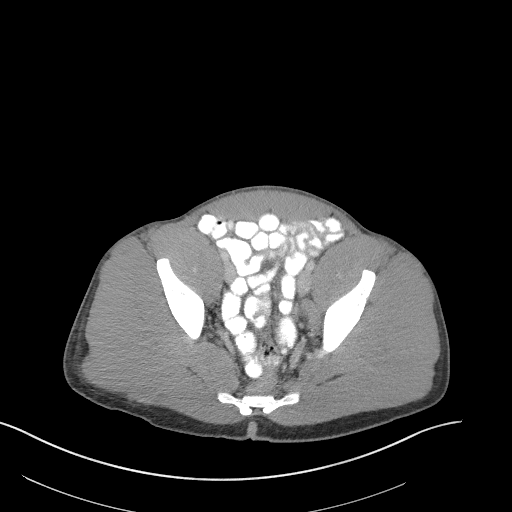
[im 42/92  soft-tissue]
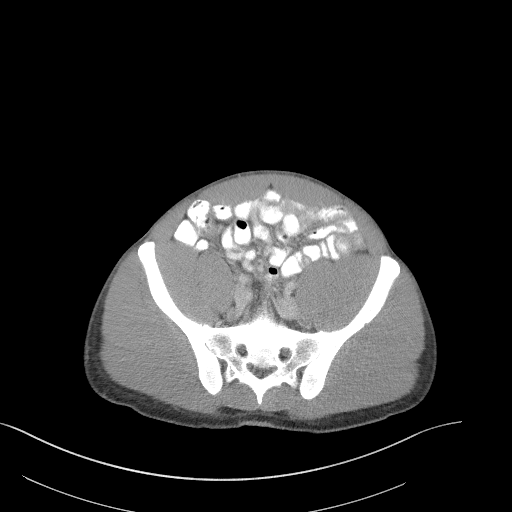
[im 50/92  soft-tissue]
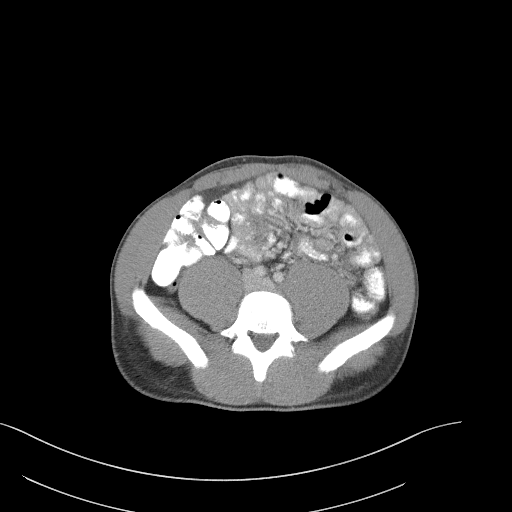
[im 57/92  soft-tissue]
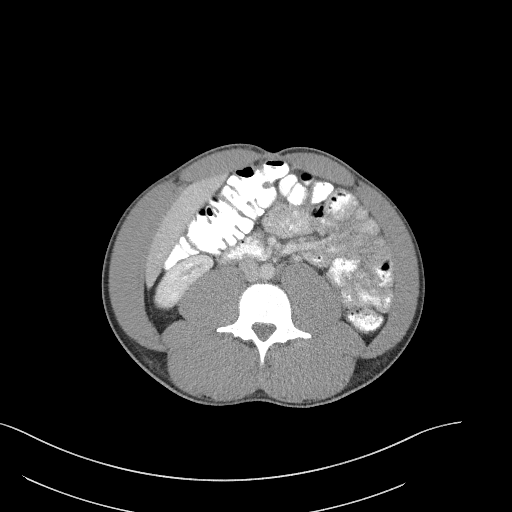
[im 65/92  soft-tissue]
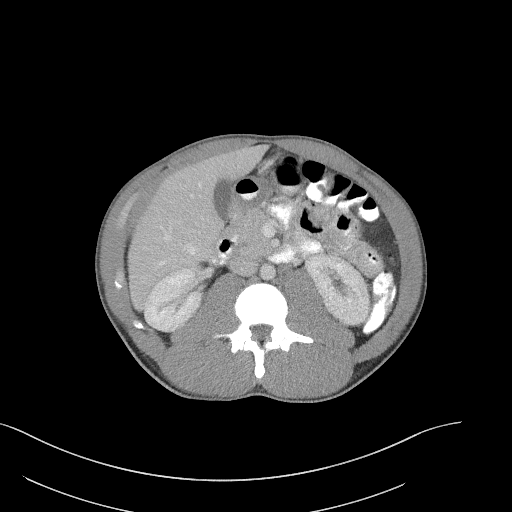
[im 65/92  bone]
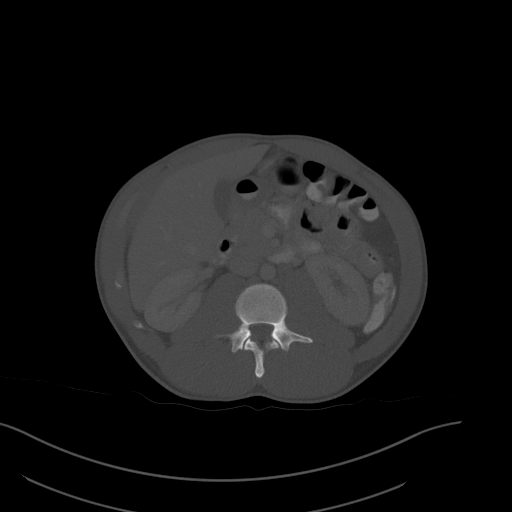
[im 73/92  soft-tissue]
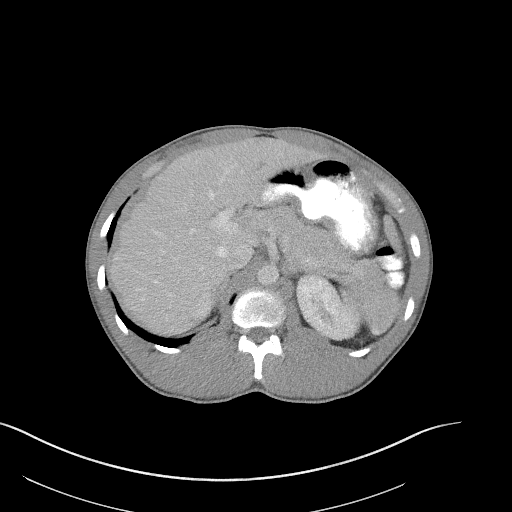
[im 80/92  soft-tissue]
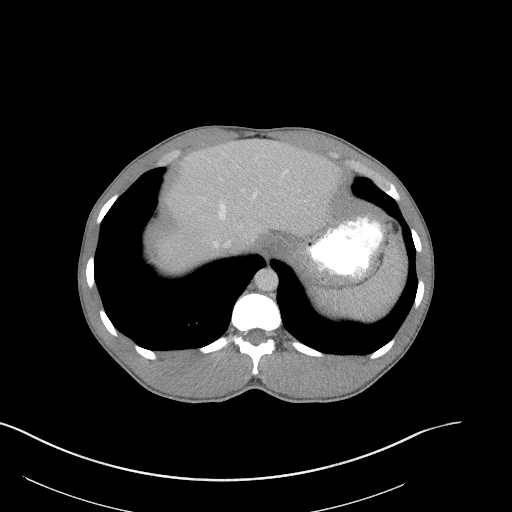
[im 88/92  soft-tissue]
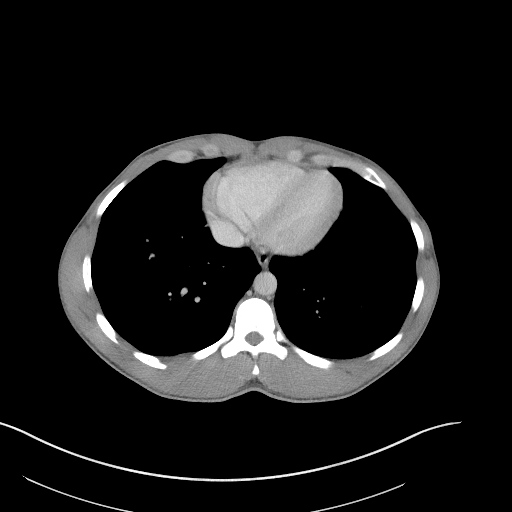

[15 of 46 positions shown; findings below may reference images not displayed]

RADIATION DOSE REDUCTION: This exam was performed according to the
departmental dose-optimization program which includes automated
exposure control, adjustment of the mA and/or kV according to
patient size and/or use of iterative reconstruction technique.

CONTRAST:  80mL OMNIPAQUE IOHEXOL 300 MG/ML  SOLN
FINDINGS: Lower chest: No acute abnormality.

Hepatobiliary: No focal liver abnormality. No gallstones,
gallbladder wall thickening, or pericholecystic fluid. No biliary
dilatation.

Pancreas: No focal lesion. Normal pancreatic contour. No surrounding
inflammatory changes. No main pancreatic ductal dilatation.

Spleen: Normal in size without focal abnormality.

Adrenals/Urinary Tract:

No adrenal nodule bilaterally.

Bilateral kidneys enhance symmetrically.

No hydronephrosis. No hydroureter.

The urinary bladder is unremarkable.

Stomach/Bowel: PO contrast reaches the sigmoid colon. Stomach is
within normal limits. No evidence of bowel wall thickening or
dilatation. Appendix appears normal.

Vascular/Lymphatic: No abdominal aorta or iliac aneurysm. No
abdominal lymphadenopathy. Query prominent 1 cm left pelvic lymph
node ([DATE]). There is a borderline enlarged left 1.5 cm co-pay lymph
node. There is an enlarged left inguinal lymph node measuring up to
2 cm.

Reproductive: Prostate is unremarkable.

Other: No intraperitoneal free fluid. No intraperitoneal free gas.
No organized fluid collection.

Musculoskeletal:

No abdominal wall hernia or abnormality.

No suspicious lytic or blastic osseous lesions. No acute displaced
fracture.
IMPRESSION: 1. Left inguinal and [place] lymphadenopathy. Query prominent 1 cm
left pelvic lymph node.
2. Otherwise no acute intra-abdominal or intrapelvic abnormality.

These results will be called to the ordering clinician or
representative by the Radiologist Assistant, and communication
documented in the PACS or [REDACTED].

## 2023-04-13 ENCOUNTER — Other Ambulatory Visit: Payer: Self-pay

## 2023-04-27 ENCOUNTER — Other Ambulatory Visit (HOSPITAL_COMMUNITY): Payer: Self-pay

## 2023-04-29 ENCOUNTER — Ambulatory Visit (INDEPENDENT_AMBULATORY_CARE_PROVIDER_SITE_OTHER): Payer: 59 | Admitting: Pharmacist

## 2023-04-29 ENCOUNTER — Other Ambulatory Visit (HOSPITAL_COMMUNITY): Payer: Self-pay

## 2023-04-29 ENCOUNTER — Other Ambulatory Visit: Payer: Self-pay

## 2023-04-29 ENCOUNTER — Telehealth: Payer: Self-pay

## 2023-04-29 DIAGNOSIS — Z2981 Encounter for HIV pre-exposure prophylaxis: Secondary | ICD-10-CM | POA: Diagnosis not present

## 2023-04-29 DIAGNOSIS — Z113 Encounter for screening for infections with a predominantly sexual mode of transmission: Secondary | ICD-10-CM

## 2023-04-29 MED ORDER — DOXYCYCLINE HYCLATE 100 MG PO TABS: ORAL_TABLET | ORAL | 0 refills | Status: DC

## 2023-04-29 NOTE — Telephone Encounter (Signed)
 [facility] Patient Advocate Encounter   Received notification from [facility]) that prior authorization for Apretude  is required. (Medical Benefits)   PA submitted on 04/29/23 Key [id number] Status is pending  (Medication is not covered under Pharmacy Benefits)    [facility] will continue to follow.   Arland Hutchinson, CPhT Specialty Pharmacy Patient [facility] Phone: [phone] Fax:  [phone]

## 2023-04-29 NOTE — Progress Notes (Signed)
 Date:  04/29/2023   HPI: Mario Nguyen is a 33 y.o. male who presents to the [facility] pharmacy clinic for HIV PrEP follow-up.  Insured   [x]    Uninsured  []    Patient Active Problem List   Diagnosis Date Noted   Low TSH level 10/01/2022   Genital lesion, male 10/01/2022   Encounter for general adult medical examination with abnormal findings 09/10/2021   Encounter for examination following treatment at hospital 07/09/2021   Syphilis 07/09/2021   LAD (lymphadenopathy), inguinal 07/09/2021   Gastroesophageal reflux disease without esophagitis 05/28/2021   Hemorrhoids 08/01/2020   Depression, major, single episode, moderate (HCC) 08/01/2020   Intertrigo 08/01/2020   Tobacco abuse 08/03/2013    Patient's Medications  New Prescriptions   No medications on file  Previous Medications   CLOTRIMAZOLE -BETAMETHASONE  (LOTRISONE ) CREAM    Apply 1 Application topically daily.   EMTRICITABINE -TENOFOVIR  AF (DESCOVY ) 200-25 MG TABLET    Take 1 tablet by mouth daily.   FLUTICASONE  (FLONASE ) 50 MCG/ACT NASAL SPRAY    Place 1 spray into both nostrils 2 (two) times daily.   HYDROCORTISONE  (ANUSOL -HC) 25 MG SUPPOSITORY    Place 1 suppository (25 mg total) rectally 2 (two) times daily as needed for hemorrhoids or anal itching.   ONDANSETRON  (ZOFRAN ) 4 MG TABLET    Take 1 tablet (4 mg total) by mouth every 6 (six) hours.  Modified Medications   No medications on file  Discontinued Medications   No medications on file    Allergies: No Known Allergies  Past Medical History: Past Medical History:  Diagnosis Date   Encounter for hepatitis C screening test for low risk patient 06/28/2019    Social History: Social History   Socioeconomic History   Marital status: Single    Spouse name: Not on file   Number of children: Not on file   Years of education: 10   Highest education level: 10th grade  Occupational History   Not on file  Tobacco Use   Smoking status: Some Days    Current  packs/day: 0.30    Average packs/day: 0.3 packs/day for 6.0 years (1.8 ttl pk-yrs)    Types: Cigarettes   Smokeless tobacco: Never   Tobacco comments:    States working on quitting  Vaping Use   Vaping status: Never Used  Substance and Sexual Activity   Alcohol use: Yes    Comment: occasional   Drug use: Yes    Frequency: 7.0 times per week    Types: Marijuana   Sexual activity: Yes    Partners: Male    Birth control/protection: None    Comment: accepted condoms  Other Topics Concern   Not on file  Social History Narrative   Lives fiance [place] 8 years    Dog: Allison Storm y 3 years      Enjoys gardening, listening to music, just relaxing.      Diet: Well balanced, but does like to eat sweets.   Caffeine: Does not drink much tea or sodas, does not drink coffee, most drinks Gatorade.    Water: 3-5 bottles of water daily      Wears seat belt   Does not wear sun protection   Smoke detectors   Occasional use of phone while driving   Social Drivers of Corporate Investment Banker Strain: Low Risk  (09/28/2018)   Overall Financial Resource Strain (CARDIA)    Difficulty of Paying Living Expenses: Not hard at all  Food Insecurity: No  Food Insecurity (09/28/2018)   Hunger Vital Sign    Worried About Running Out of Food in the Last Year: Never true    Ran Out of Food in the Last Year: Never true  Transportation Needs: No Transportation Needs (09/28/2018)   PRAPARE - Administrator, Civil Service (Medical): No    Lack of Transportation (Non-Medical): No  Physical Activity: Insufficiently Active (09/28/2018)   Exercise Vital Sign    Days of Exercise per Week: 1 day    Minutes of Exercise per Session: 30 min  Stress: Stress Concern Present (09/28/2018)   [employer] of Occupational Health - Occupational Stress Questionnaire    Feeling of Stress : To some extent  Social Connections: Moderately Isolated (09/28/2018)   Social Connection and Isolation Panel [NHANES]     Frequency of Communication with Friends and Family: Once a week    Frequency of Social Gatherings with Friends and Family: More than three times a week    Attends Religious Services: Never    Database Administrator or Organizations: No    Attends Banker Meetings: Never    Marital Status: Never married       04/08/2022    3:36 PM  [facility] HIV PREP FLOWSHEET RESULTS  Insurance Status Uninsured  Gender at birth Male  Gender identity cis-Male  Risk for HIV Hx of STI  Sex Partners Men only  # sex partners past 3-6 mos 2  Sex activity preferences Insertive and receptive;Oral  Condom use Yes  % condom use 50  Treated for STI? No  PrEP Eligibility Yes    Labs:  SCr: Lab Results  Component Value Date   CREATININE 1.10 10/01/2022   CREATININE 1.12 01/01/2022   CREATININE 1.06 07/05/2021   CREATININE 1.17 05/28/2021   CREATININE 1.02 08/01/2020   HIV Lab Results  Component Value Date   HIV NON-REACTIVE 02/17/2023   HIV NON-REACTIVE 11/20/2022   HIV NON-REACTIVE 08/20/2022   HIV NON-REACTIVE 07/04/2022   HIV NON-REACTIVE 04/08/2022   Hepatitis B Lab Results  Component Value Date   HEPBSAG NON-REACTIVE 09/26/2021   HEPBCAB NON-REACTIVE 09/26/2021   Hepatitis C Lab Results  Component Value Date   HEPCAB NON-REACTIVE 09/26/2021   Hepatitis A Lab Results  Component Value Date   HAV NON-REACTIVE 08/14/2021   RPR and STI Lab Results  Component Value Date   LABRPR NON-REACTIVE 02/17/2023   LABRPR NON-REACTIVE 11/20/2022   LABRPR NON-REACTIVE 07/04/2022   LABRPR NON-REACTIVE 04/08/2022   LABRPR NON-REACTIVE 01/01/2022    STI Results GC GC CT CT  02/17/2023  4:19 PM Negative    Negative    Negative   Negative    Negative    Negative    11/20/2022  9:04 AM Negative    Negative    Negative   Negative    Negative    Negative    08/20/2022  9:25 AM Negative    Negative   Negative    Negative    04/08/2022  3:49 PM Negative    Negative     Negative   Negative    Negative    Negative    01/01/2022  4:40 PM Negative   Negative    01/01/2022  4:16 PM Negative    Negative   Negative    Negative    09/26/2021 11:32 AM Negative    Negative    Negative   Negative    Negative    Negative  08/14/2021 10:18 AM Negative    Negative    Negative   Negative    Negative    Negative    07/05/2021 12:58 AM Negative   Negative    06/28/2019  3:01 PM Negative   Negative    08/29/2015 10:27 AM  NOT DETECTED   NOT DETECTED     Assessment: Huckleberry presents to clinic today for PrEP follow-up. Screened patient for acute HIV symptoms such as fatigue, muscle aches, rash, sore throat, lymphadenopathy, headache, night sweats, nausea/vomiting/diarrhea, and fever.  Continues to struggle with adherence due to forgetfulness and work schedule. States he still has 15 tablets left in the bottle he last filled in August; he did have a surplus of medication from non-adherence earlier in 2024. Will check HIV antibody and send in refill once it results negative.  Reviewed alternatives today - primarily Apretude  - which he agreed to trial. He seems much more interested in this stable option for him even with his concern over needles/injections. Will start benefits investigation today.   Last STI screening was in October and was negative. Active with new sexual partners since last visit; will check full STI screening today. States he had one encounter where a condom broke. Previously utilized doxyPEP but had no more refills. Sent more refills in for him today and encouraged him to reach out for them as needed when he runs out.   Due for HAV, COVID, HPV, and MPX vaccines today which he continues to decline.   Plan: - Check HIV antibody, RPR, and urine/oral/rectal cytologies - Refill doxyPEP - Follow up on Apretude  approval and schedule appropriately   Alan Geralds, PharmD, CPP, BCIDP, AAHIVP Clinical Pharmacist Practitioner Infectious Diseases Clinical  Pharmacist [facility] 04/29/2023, 10:20 AM

## 2023-04-30 ENCOUNTER — Other Ambulatory Visit: Payer: Self-pay

## 2023-05-01 ENCOUNTER — Other Ambulatory Visit: Payer: Self-pay

## 2023-05-03 LAB — CT/NG RNA, TMA RECTAL
Chlamydia Trachomatis RNA: NOT DETECTED
Neisseria Gonorrhoeae RNA: NOT DETECTED

## 2023-05-03 LAB — GC/CHLAMYDIA PROBE, AMP (THROAT)
Chlamydia trachomatis RNA: NOT DETECTED
Neisseria gonorrhoeae RNA: NOT DETECTED

## 2023-05-03 LAB — C. TRACHOMATIS/N. GONORRHOEAE RNA
C. trachomatis RNA, TMA: NOT DETECTED
N. gonorrhoeae RNA, TMA: NOT DETECTED

## 2023-05-03 LAB — HIV ANTIBODY (ROUTINE TESTING W REFLEX): HIV 1&2 Ab, 4th Generation: NONREACTIVE

## 2023-05-03 LAB — RPR: RPR Ser Ql: NONREACTIVE

## 2023-05-08 ENCOUNTER — Other Ambulatory Visit: Payer: Self-pay

## 2023-05-15 ENCOUNTER — Telehealth: Payer: Self-pay

## 2023-05-15 ENCOUNTER — Other Ambulatory Visit (HOSPITAL_COMMUNITY): Payer: Self-pay

## 2023-05-15 ENCOUNTER — Encounter: Payer: Self-pay | Admitting: Pharmacist

## 2023-05-15 NOTE — Telephone Encounter (Signed)
Pharmacy Patient Advocate Encounter- Apretude BIV-Medical Benefit:  J code: [id number] CPT code: 60454  Dx Code: Z72.53  No PA is required through Medical Benefits. Authorization# [id number]

## 2023-05-15 NOTE — Telephone Encounter (Signed)
LVM with patient and sent MyChart message today requesting scheduling.

## 2023-05-19 NOTE — Progress Notes (Unsigned)
HPI: Mario Nguyen is a 33 y.o. male who presents to the [facility] pharmacy clinic for Apretude administration and HIV PrEP follow up.  Insured   [x]    Uninsured  []    Patient Active Problem List   Diagnosis Date Noted   Low TSH level 10/01/2022   Genital lesion, male 10/01/2022   Encounter for general adult medical examination with abnormal findings 09/10/2021   Encounter for examination following treatment at hospital 07/09/2021   Syphilis 07/09/2021   LAD (lymphadenopathy), inguinal 07/09/2021   Gastroesophageal reflux disease without esophagitis 05/28/2021   Hemorrhoids 08/01/2020   Depression, major, single episode, moderate (HCC) 08/01/2020   Intertrigo 08/01/2020   Tobacco abuse 08/03/2013    Patient's Medications  New Prescriptions   No medications on file  Previous Medications   CLOTRIMAZOLE-BETAMETHASONE (LOTRISONE) CREAM    Apply 1 Application topically daily.   DOXYCYCLINE (VIBRA-TABS) 100 MG TABLET    Take 2 tablets (200 mg) by mouth within 24 hours after but no later than 72 hours after condomless sex. If you have sex again within 24 hours of taking doxycycline, take another dose 24 hours after your last dose.   EMTRICITABINE-TENOFOVIR AF (DESCOVY) 200-25 MG TABLET    Take 1 tablet by mouth daily.   FLUTICASONE (FLONASE) 50 MCG/ACT NASAL SPRAY    Place 1 spray into both nostrils 2 (two) times daily.   HYDROCORTISONE (ANUSOL-HC) 25 MG SUPPOSITORY    Place 1 suppository (25 mg total) rectally 2 (two) times daily as needed for hemorrhoids or anal itching.   ONDANSETRON (ZOFRAN) 4 MG TABLET    Take 1 tablet (4 mg total) by mouth every 6 (six) hours.  Modified Medications   No medications on file  Discontinued Medications   No medications on file    Allergies: No Known Allergies  Labs: Lab Results  Component Value Date   HIV1RNAQUANT Not Detected 07/04/2022   HIV1RNAQUANT Not Detected 01/06/2022   HIV1RNAQUANT Not Detected 09/26/2021    RPR and STI Lab  Results  Component Value Date   LABRPR NON-REACTIVE 04/29/2023   LABRPR NON-REACTIVE 02/17/2023   LABRPR NON-REACTIVE 11/20/2022   LABRPR NON-REACTIVE 07/04/2022   LABRPR NON-REACTIVE 04/08/2022    STI Results GC GC CT CT  02/17/2023  4:19 PM Negative    Negative    Negative   Negative    Negative    Negative    11/20/2022  9:04 AM Negative    Negative    Negative   Negative    Negative    Negative    08/20/2022  9:25 AM Negative    Negative   Negative    Negative    04/08/2022  3:49 PM Negative    Negative    Negative   Negative    Negative    Negative    01/01/2022  4:40 PM Negative   Negative    01/01/2022  4:16 PM Negative    Negative   Negative    Negative    09/26/2021 11:32 AM Negative    Negative    Negative   Negative    Negative    Negative    08/14/2021 10:18 AM Negative    Negative    Negative   Negative    Negative    Negative    07/05/2021 12:58 AM Negative   Negative    06/28/2019  3:01 PM Negative   Negative    08/29/2015 10:27 AM  NOT DETECTED   NOT DETECTED  Hepatitis B Lab Results  Component Value Date   HEPBSAG NON-REACTIVE 09/26/2021   HEPBCAB NON-REACTIVE 09/26/2021   Hepatitis C Lab Results  Component Value Date   HEPCAB NON-REACTIVE 09/26/2021   Hepatitis A Lab Results  Component Value Date   HAV NON-REACTIVE 08/14/2021   Lipids: Lab Results  Component Value Date   CHOL 163 10/01/2022   TRIG 149 10/01/2022   HDL 50 10/01/2022   CHOLHDL 3.3 10/01/2022   VLDL 15 08/29/2015   LDLCALC 87 10/01/2022    Current PrEP Regimen: Descovy  TARGET DATE: 29th day of the month  Assessment: Elison presents today for their first initiation injection of Apretude and to follow up for HIV PrEP. Last seen 04/29/23 for 74-month PrEP follow up with Descovy. Shared adherence issues with oral PrEP regimen due to forgetfulness and work schedule. Alternatives were discussed, and he agreed to Apretude trial.   Counseled that Apretude is one  intramuscular injection in the gluteal muscle for each visit. Explained that the second injection is 30 days after the initial injection then every 2 months thereafter. Discussed follow up appointments moving forward. HIV Ag/Ab from 04/29/23 was non-reactive. Screened for acute HIV symptoms such as fatigue, muscle aches, rash, sore throat, lymphadenopathy, headache, night sweats, nausea/vomiting/diarrhea, and fever. Denies any symptoms. No known exposures to any STIs since last visit. Last STI testing was from 04/29/23 and were negative for syphilis/chlamydia/gonorrhea with RPR and oral/rectal cytologies. *** Agrees to full STI testing today with RPR and oral/urine/rectal cytologies.   Explained that showing up to injection appointments is very important and warned that if appointments are missed, protection will be minimal and the risk of acquiring HIV becomes much higher. The concept of target date and +/- 7 days follow up window was also explained to him. Counseled on possible side effects associated with the injections such as injection site pain, which is usually mild to moderate in nature, injection site nodules, and injection site reactions. Asked to call the clinic or send me a mychart message if they experience any issues. Advised that he can take Motrin or Tylenol for injection site pain if needed. He may also pre-treat with Motrin or Tylenol 30-45 minutes before scheduled appointments.   Vaccination: Due but declined HAV, HBV, influenza, COVID, HPV, and MPX vaccines from last visit. *** Continues to decline the listed vaccines.   Per [employer] guidelines, a rapid HIV test should be drawn prior to Apretude administration. Due to state shortage of rapid HIV tests, this is temporarily unable to be done. Per decision from [facility] physicians, we will proceed with Apretude administration at this time without a negative rapid HIV test beforehand. HIV RNA was collected today and is in process.  Administered  cabotegravir 600mg /61mL in *** upper outer quadrant of the gluteal muscle. Monitored patient for 10 minutes after injection. Injection was tolerated well without issue. Counseled to stop taking Descovy after today's dose and to call with any issues that may arise. Will make follow up appointments for second initiation injection in 30 days and then maintenance injections every 2 months thereafter.   Plan: - Stop Descovy after today's dose - First Apretude injection administered - HIV RNA - Second initiation injection scheduled for *** - Maintenance injections scheduled for *** - Call with any issues or questions  Dimple Casey. Edwena Blow, PharmD Candidate [facility] [employer]

## 2023-05-20 ENCOUNTER — Ambulatory Visit: Payer: 59 | Admitting: Pharmacist

## 2023-05-22 NOTE — Progress Notes (Deleted)
 NEW REFERRAL TO CPP CLINIC    HPI: Mario Nguyen is a 33 y.o. male who presents to the [facility] pharmacy clinic for Apretude  administration and HIV PrEP follow up.  Patient Active Problem List   Diagnosis Date Noted   Low TSH level 10/01/2022   Genital lesion, male 10/01/2022   Encounter for general adult medical examination with abnormal findings 09/10/2021   Encounter for examination following treatment at hospital 07/09/2021   Syphilis 07/09/2021   LAD (lymphadenopathy), inguinal 07/09/2021   Gastroesophageal reflux disease without esophagitis 05/28/2021   Hemorrhoids 08/01/2020   Depression, major, single episode, moderate (HCC) 08/01/2020   Intertrigo 08/01/2020   Tobacco abuse 08/03/2013    Patient's Medications  New Prescriptions   No medications on file  Previous Medications   CLOTRIMAZOLE -BETAMETHASONE  (LOTRISONE ) CREAM    Apply 1 Application topically daily.   DOXYCYCLINE  (VIBRA -TABS) 100 MG TABLET    Take 2 tablets (200 mg) by mouth within 24 hours after but no later than 72 hours after condomless sex. If you have sex again within 24 hours of taking doxycycline , take another dose 24 hours after your last dose.   EMTRICITABINE -TENOFOVIR  AF (DESCOVY ) 200-25 MG TABLET    Take 1 tablet by mouth daily.   FLUTICASONE  (FLONASE ) 50 MCG/ACT NASAL SPRAY    Place 1 spray into both nostrils 2 (two) times daily.   HYDROCORTISONE  (ANUSOL -HC) 25 MG SUPPOSITORY    Place 1 suppository (25 mg total) rectally 2 (two) times daily as needed for hemorrhoids or anal itching.   ONDANSETRON  (ZOFRAN ) 4 MG TABLET    Take 1 tablet (4 mg total) by mouth every 6 (six) hours.  Modified Medications   No medications on file  Discontinued Medications   No medications on file    Allergies: No Known Allergies  Past Medical History: Past Medical History:  Diagnosis Date   Encounter for hepatitis C screening test for low risk patient 06/28/2019    Social History: Social History   Socioeconomic  History   Marital status: Single    Spouse name: Not on file   Number of children: Not on file   Years of education: 10   Highest education level: 10th grade  Occupational History   Not on file  Tobacco Use   Smoking status: Some Days    Current packs/day: 0.30    Average packs/day: 0.3 packs/day for 6.0 years (1.8 ttl pk-yrs)    Types: Cigarettes   Smokeless tobacco: Never   Tobacco comments:    States working on quitting  Vaping Use   Vaping status: Never Used  Substance and Sexual Activity   Alcohol use: Yes    Comment: occasional   Drug use: Yes    Frequency: 7.0 times per week    Types: Marijuana   Sexual activity: Yes    Partners: Male    Birth control/protection: None    Comment: accepted condoms  Other Topics Concern   Not on file  Social History Narrative   Lives fiance [place] 8 years    Dog: [place] Storm y 3 years      Enjoys gardening, listening to music, just relaxing.      Diet: Well balanced, but does like to eat sweets.   Caffeine: Does not drink much tea or sodas, does not drink coffee, most drinks Gatorade.    Water: 3-5 bottles of water daily      Wears seat belt   Does not wear sun protection   Smoke detectors  Occasional use of phone while driving   Social Drivers of Health   Financial Resource Strain: Low Risk  (09/28/2018)   Overall Financial Resource Strain (CARDIA)    Difficulty of Paying Living Expenses: Not hard at all  Food Insecurity: No Food Insecurity (09/28/2018)   Hunger Vital Sign    Worried About Running Out of Food in the Last Year: Never true    Ran Out of Food in the Last Year: Never true  Transportation Needs: No Transportation Needs (09/28/2018)   PRAPARE - Administrator, Civil Service (Medical): No    Lack of Transportation (Non-Medical): No  Physical Activity: Insufficiently Active (09/28/2018)   Exercise Vital Sign    Days of Exercise per Week: 1 day    Minutes of Exercise per Session: 30 min  Stress: Stress  Concern Present (09/28/2018)   [employer] of Occupational Health - Occupational Stress Questionnaire    Feeling of Stress : To some extent  Social Connections: Moderately Isolated (09/28/2018)   Social Connection and Isolation Panel [NHANES]    Frequency of Communication with Friends and Family: Once a week    Frequency of Social Gatherings with Friends and Family: More than three times a week    Attends Religious Services: Never    Database Administrator or Organizations: No    Attends Banker Meetings: Never    Marital Status: Never married    Labs: Lab Results  Component Value Date   HIV1RNAQUANT Not Detected 07/04/2022   HIV1RNAQUANT Not Detected 01/06/2022   HIV1RNAQUANT Not Detected 09/26/2021    RPR and STI Lab Results  Component Value Date   LABRPR NON-REACTIVE 04/29/2023   LABRPR NON-REACTIVE 02/17/2023   LABRPR NON-REACTIVE 11/20/2022   LABRPR NON-REACTIVE 07/04/2022   LABRPR NON-REACTIVE 04/08/2022    STI Results GC GC CT CT  02/17/2023  4:19 PM Negative    Negative    Negative   Negative    Negative    Negative    11/20/2022  9:04 AM Negative    Negative    Negative   Negative    Negative    Negative    08/20/2022  9:25 AM Negative    Negative   Negative    Negative    04/08/2022  3:49 PM Negative    Negative    Negative   Negative    Negative    Negative    01/01/2022  4:40 PM Negative   Negative    01/01/2022  4:16 PM Negative    Negative   Negative    Negative    09/26/2021 11:32 AM Negative    Negative    Negative   Negative    Negative    Negative    08/14/2021 10:18 AM Negative    Negative    Negative   Negative    Negative    Negative    07/05/2021 12:58 AM Negative   Negative    06/28/2019  3:01 PM Negative   Negative    08/29/2015 10:27 AM  NOT DETECTED   NOT DETECTED     Hepatitis B Lab Results  Component Value Date   HEPBSAG NON-REACTIVE 09/26/2021   HEPBCAB NON-REACTIVE 09/26/2021   Hepatitis C Lab  Results  Component Value Date   HEPCAB NON-REACTIVE 09/26/2021   Hepatitis A Lab Results  Component Value Date   HAV NON-REACTIVE 08/14/2021   Lipids: Lab Results  Component Value Date   CHOL 163 10/01/2022  TRIG 149 10/01/2022   HDL 50 10/01/2022   CHOLHDL 3.3 10/01/2022   VLDL 15 08/29/2015   LDLCALC 87 10/01/2022    TARGET DATE: The *** of the month  Current PrEP Regimen: Descovy  (non-adherent)  Assessment: Mario Nguyen presents today for their first initiation injection of Apretude  and to follow up for HIV PrEP.  Counseled that Apretude  is one intramuscular injection in the gluteal muscle for each visit. Explained that the second injection is 30 days after the initial injection then every 2 months thereafter. Discussed follow up appointments moving forward. Screened patient for acute HIV symptoms such as fatigue, muscle aches, rash, sore throat, lymphadenopathy, headache, night sweats, nausea/vomiting/diarrhea, and fever. Patient denies any symptoms.   Explained that showing up to injection appointments is very important and warned that if appointments are missed, protection will be minimal and the risk of acquiring HIV becomes much higher. Counseled on possible side effects associated with the injections such as injection site pain, which is usually mild to moderate in nature, injection site nodules, and injection site reactions. Asked to call the clinic or send me a mychart message if they experience any issues. Advised that they can take Motrin  or Tylenol  for injection site pain if needed. They may also pre-treat with Motrin  or Tylenol  about 30-45 minutes before scheduled appointments.    Per [employer] guidelines, a rapid HIV test should be drawn prior to Apretude  administration. Due to state shortage of rapid HIV tests, this is temporarily unable to be done. Per decision from [facility] physicians, we will proceed with Apretude  administration at this time without a negative rapid HIV  test beforehand. HIV RNA was collected today and is in process.  Administered cabotegravir  600mg /64mL in *** upper outer quadrant of the gluteal muscle. Monitored patient for 10 minutes after injection. Injections were tolerated well without issue. Counseled to stop taking Descovy  after today's dose and to call with any issues that may arise. Will make follow up appointments for second initiation injection in 30 days and then maintenance injections every 2 months thereafter.   Plan: - Stop Descovy  - First Apretude  injection administered - Second initiation injection scheduled for *** - Maintenance injections scheduled for *** - Check HIV RNA and *** - Call with any issues or questions  Alan Geralds, PharmD, CPP, BCIDP, AAHIVP Clinical Pharmacist Practitioner Infectious Diseases Clinical Pharmacist [facility]

## 2023-05-27 ENCOUNTER — Ambulatory Visit: Payer: 59 | Admitting: Pharmacist

## 2023-06-02 ENCOUNTER — Other Ambulatory Visit (HOSPITAL_COMMUNITY)
Admission: RE | Admit: 2023-06-02 | Discharge: 2023-06-02 | Disposition: A | Discharge status: Home / Self Care | Payer: 59 | Source: Ambulatory Visit | Attending: Internal Medicine | Admitting: Internal Medicine

## 2023-06-02 ENCOUNTER — Ambulatory Visit (INDEPENDENT_AMBULATORY_CARE_PROVIDER_SITE_OTHER): Payer: 59 | Admitting: Pharmacist

## 2023-06-02 ENCOUNTER — Other Ambulatory Visit: Payer: Self-pay

## 2023-06-02 DIAGNOSIS — Z113 Encounter for screening for infections with a predominantly sexual mode of transmission: Secondary | ICD-10-CM

## 2023-06-02 DIAGNOSIS — Z2981 Encounter for HIV pre-exposure prophylaxis: Secondary | ICD-10-CM | POA: Diagnosis not present

## 2023-06-02 MED ORDER — CABOTEGRAVIR ER 600 MG/3ML IM SUER
600.0000 mg | Freq: Once | INTRAMUSCULAR | Status: AC
Start: 2023-06-02 — End: 2023-06-02
  Administered 2023-06-02: 600 mg via INTRAMUSCULAR

## 2023-06-02 NOTE — Progress Notes (Addendum)
HPI: Mario Nguyen is a 33 y.o. male who presents to the [facility] pharmacy clinic for Apretude administration and HIV PrEP follow up.  Patient Active Problem List   Diagnosis Date Noted   Low TSH level 10/01/2022   Genital lesion, male 10/01/2022   Encounter for general adult medical examination with abnormal findings 09/10/2021   Encounter for examination following treatment at hospital 07/09/2021   Syphilis 07/09/2021   LAD (lymphadenopathy), inguinal 07/09/2021   Gastroesophageal reflux disease without esophagitis 05/28/2021   Hemorrhoids 08/01/2020   Depression, major, single episode, moderate (HCC) 08/01/2020   Intertrigo 08/01/2020   Tobacco abuse 08/03/2013    Patient's Medications  New Prescriptions   No medications on file  Previous Medications   CLOTRIMAZOLE-BETAMETHASONE (LOTRISONE) CREAM    Apply 1 Application topically daily.   DOXYCYCLINE (VIBRA-TABS) 100 MG TABLET    Take 2 tablets (200 mg) by mouth within 24 hours after but no later than 72 hours after condomless sex. If you have sex again within 24 hours of taking doxycycline, take another dose 24 hours after your last dose.   EMTRICITABINE-TENOFOVIR AF (DESCOVY) 200-25 MG TABLET    Take 1 tablet by mouth daily.   FLUTICASONE (FLONASE) 50 MCG/ACT NASAL SPRAY    Place 1 spray into both nostrils 2 (two) times daily.   HYDROCORTISONE (ANUSOL-HC) 25 MG SUPPOSITORY    Place 1 suppository (25 mg total) rectally 2 (two) times daily as needed for hemorrhoids or anal itching.   ONDANSETRON (ZOFRAN) 4 MG TABLET    Take 1 tablet (4 mg total) by mouth every 6 (six) hours.  Modified Medications   No medications on file  Discontinued Medications   No medications on file    Allergies: No Known Allergies  Past Medical History: Past Medical History:  Diagnosis Date   Encounter for hepatitis C screening test for low risk patient 06/28/2019    Social History: Social History   Socioeconomic History   Marital  status: Single    Spouse name: Not on file   Number of children: Not on file   Years of education: 10   Highest education level: 10th grade  Occupational History   Not on file  Tobacco Use   Smoking status: Some Days    Current packs/day: 0.30    Average packs/day: 0.3 packs/day for 6.0 years (1.8 ttl pk-yrs)    Types: Cigarettes   Smokeless tobacco: Never   Tobacco comments:    States working on quitting  Vaping Use   Vaping status: Never Used  Substance and Sexual Activity   Alcohol use: Yes    Comment: occasional   Drug use: Yes    Frequency: 7.0 times per week    Types: Marijuana   Sexual activity: Yes    Partners: Male    Birth control/protection: None    Comment: accepted condoms  Other Topics Concern   Not on file  Social History Narrative   Lives fiance [place] 8 years    Dog: [place] Storm y 3 years      Enjoys gardening, listening to music, just relaxing.      Diet: Well balanced, but does like to eat sweets.   Caffeine: Does not drink much tea or sodas, does not drink coffee, most drinks Gatorade.    Water: 3-5 bottles of water daily      Wears seat belt   Does not wear sun protection   Smoke detectors   Occasional use of phone  while driving   Social Drivers of Corporate investment banker Strain: Low Risk  (09/28/2018)   Overall Financial Resource Strain (CARDIA)    Difficulty of Paying Living Expenses: Not hard at all  Food Insecurity: No Food Insecurity (09/28/2018)   Hunger Vital Sign    Worried About Running Out of Food in the Last Year: Never true    Ran Out of Food in the Last Year: Never true  Transportation Needs: No Transportation Needs (09/28/2018)   PRAPARE - Administrator, Civil Service (Medical): No    Lack of Transportation (Non-Medical): No  Physical Activity: Insufficiently Active (09/28/2018)   Exercise Vital Sign    Days of Exercise per Week: 1 day    Minutes of Exercise per Session: 30 min  Stress: Stress Concern Present  (09/28/2018)   [employer] of Occupational Health - Occupational Stress Questionnaire    Feeling of Stress : To some extent  Social Connections: Moderately Isolated (09/28/2018)   Social Connection and Isolation Panel [NHANES]    Frequency of Communication with Friends and Family: Once a week    Frequency of Social Gatherings with Friends and Family: More than three times a week    Attends Religious Services: Never    Database administrator or Organizations: No    Attends Banker Meetings: Never    Marital Status: Never married    Labs: Lab Results  Component Value Date   HIV1RNAQUANT Not Detected 07/04/2022   HIV1RNAQUANT Not Detected 01/06/2022   HIV1RNAQUANT Not Detected 09/26/2021    RPR and STI Lab Results  Component Value Date   LABRPR NON-REACTIVE 04/29/2023   LABRPR NON-REACTIVE 02/17/2023   LABRPR NON-REACTIVE 11/20/2022   LABRPR NON-REACTIVE 07/04/2022   LABRPR NON-REACTIVE 04/08/2022    STI Results GC GC CT CT  02/17/2023  4:19 PM Negative    Negative    Negative   Negative    Negative    Negative    11/20/2022  9:04 AM Negative    Negative    Negative   Negative    Negative    Negative    08/20/2022  9:25 AM Negative    Negative   Negative    Negative    04/08/2022  3:49 PM Negative    Negative    Negative   Negative    Negative    Negative    01/01/2022  4:40 PM Negative   Negative    01/01/2022  4:16 PM Negative    Negative   Negative    Negative    09/26/2021 11:32 AM Negative    Negative    Negative   Negative    Negative    Negative    08/14/2021 10:18 AM Negative    Negative    Negative   Negative    Negative    Negative    07/05/2021 12:58 AM Negative   Negative    06/28/2019  3:01 PM Negative   Negative    08/29/2015 10:27 AM  NOT DETECTED   NOT DETECTED     Hepatitis B Lab Results  Component Value Date   HEPBSAG NON-REACTIVE 09/26/2021   HEPBCAB NON-REACTIVE 09/26/2021   Hepatitis C Lab Results  Component  Value Date   HEPCAB NON-REACTIVE 09/26/2021   Hepatitis A Lab Results  Component Value Date   HAV NON-REACTIVE 08/14/2021   Lipids: Lab Results  Component Value Date   CHOL 163 10/01/2022   TRIG 149 10/01/2022  HDL 50 10/01/2022   CHOLHDL 3.3 10/01/2022   VLDL 15 08/29/2015   LDLCALC 87 10/01/2022    TARGET DATE: The 11 of the month  Current PrEP Regimen: Descovy (non-adherent)  Assessment: Jackey presents today for their first initiation injection of Apretude and to follow up for HIV PrEP. Last seen 1/8 and agreed to switch to Apretude from Descovy due to inconsistency taking daily oral medication. Also sent Rx for DoxyPEP at last visit. Since then he denies use but still has some on hand.  Counseled that Apretude is one intramuscular injection in the gluteal muscle for each visit. Explained that the second injection is 30 days after the initial injection then every 2 months thereafter. Discussed follow up appointments moving forward. Screened patient for acute HIV symptoms such as fatigue, muscle aches, rash, sore throat, lymphadenopathy, headache, night sweats, nausea/vomiting/diarrhea, and fever. Patient denies any symptoms.   Explained that showing up to injection appointments is very important and warned that if appointments are missed, protection will be minimal and the risk of acquiring HIV becomes much higher. Counseled on possible side effects associated with the injections such as injection site pain, which is usually mild to moderate in nature, injection site nodules, and injection site reactions. Asked to call the clinic or send me a mychart message if they experience any issues. Advised that they can take Motrin or Tylenol for injection site pain if needed. They may also pre-treat with Motrin or Tylenol about 30-45 minutes before scheduled appointments.    Per [employer] guidelines, a rapid HIV test should be drawn prior to Apretude administration. Due to state  shortage of rapid HIV tests, this is temporarily unable to be done. Per decision from [facility] physicians, we will proceed with Apretude administration at this time without a negative rapid HIV test beforehand. HIV RNA was collected today and is in process.  Administered cabotegravir 600mg /65mL in right upper outer quadrant of the gluteal muscle. Monitored patient for 10 minutes after injection. Injections were tolerated well without issue. Counseled to stop taking Descovy after today's dose and to call with any issues that may arise. Will make follow up appointments for second initiation injection in 30 days and then maintenance injections every 2 months thereafter.   Expressed concern for blood with urination following sexual encounters. Has occurred multiple times. Consulted provider in clinic today and recommended obtaining urine cytologies to rule out STI.  Eligible for HepA, COVID, HPV, and MPX vaccines. Agreed to COVID vaccine today (administered in R deltoid) and would like to wait until next appointments before receiving any more.    Plan: - Stop Descovy - First Apretude injection administered in RUOQ - Second initiation injection scheduled for 3/6 with Marchelle Folks - Check HIV RNA - Check urine cytologies (G/C/T) - COVID vaccine administered in R deltoid - Call with any issues or questions  Stephenie Acres, PharmD PGY1 Pharmacy Resident 06/02/2023 3:40 PM

## 2023-06-03 LAB — URINE CYTOLOGY ANCILLARY ONLY
Chlamydia: NEGATIVE
Comment: NEGATIVE
Comment: NEGATIVE
Comment: NORMAL
Neisseria Gonorrhea: NEGATIVE
Trichomonas: NEGATIVE

## 2023-06-04 LAB — HIV-1 RNA QUANT-NO REFLEX-BLD
HIV 1 RNA Quant: NOT DETECTED {copies}/mL
HIV-1 RNA Quant, Log: NOT DETECTED {Log}

## 2023-06-05 ENCOUNTER — Other Ambulatory Visit: Payer: Self-pay

## 2023-06-18 ENCOUNTER — Other Ambulatory Visit: Payer: Self-pay

## 2023-06-18 NOTE — Progress Notes (Signed)
 Patient insurance is paying for medication under medical benefits.

## 2023-06-24 NOTE — Progress Notes (Signed)
 HPI: Mario Nguyen is a 33 y.o. male who presents to the [facility] pharmacy clinic for Apretude administration and HIV PrEP follow up.  Patient Active Problem List   Diagnosis Date Noted   Low TSH level 10/01/2022   Genital lesion, male 10/01/2022   Encounter for general adult medical examination with abnormal findings 09/10/2021   Encounter for examination following treatment at hospital 07/09/2021   Syphilis 07/09/2021   LAD (lymphadenopathy), inguinal 07/09/2021   Gastroesophageal reflux disease without esophagitis 05/28/2021   Hemorrhoids 08/01/2020   Depression, major, single episode, moderate (HCC) 08/01/2020   Intertrigo 08/01/2020   Tobacco abuse 08/03/2013    Patient's Medications  New Prescriptions   No medications on file  Previous Medications   CLOTRIMAZOLE-BETAMETHASONE (LOTRISONE) CREAM    Apply 1 Application topically daily.   DOXYCYCLINE (VIBRA-TABS) 100 MG TABLET    Take 2 tablets (200 mg) by mouth within 24 hours after but no later than 72 hours after condomless sex. If you have sex again within 24 hours of taking doxycycline, take another dose 24 hours after your last dose.   EMTRICITABINE-TENOFOVIR AF (DESCOVY) 200-25 MG TABLET    Take 1 tablet by mouth daily.   FLUTICASONE (FLONASE) 50 MCG/ACT NASAL SPRAY    Place 1 spray into both nostrils 2 (two) times daily.   HYDROCORTISONE (ANUSOL-HC) 25 MG SUPPOSITORY    Place 1 suppository (25 mg total) rectally 2 (two) times daily as needed for hemorrhoids or anal itching.   ONDANSETRON (ZOFRAN) 4 MG TABLET    Take 1 tablet (4 mg total) by mouth every 6 (six) hours.  Modified Medications   No medications on file  Discontinued Medications   No medications on file    Allergies: No Known Allergies  Past Medical History: Past Medical History:  Diagnosis Date   Encounter for hepatitis C screening test for low risk patient 06/28/2019    Social History: Social History   Socioeconomic History   Marital status:  Single    Spouse name: Not on file   Number of children: Not on file   Years of education: 10   Highest education level: 10th grade  Occupational History   Not on file  Tobacco Use   Smoking status: Some Days    Current packs/day: 0.30    Average packs/day: 0.3 packs/day for 6.0 years (1.8 ttl pk-yrs)    Types: Cigarettes   Smokeless tobacco: Never   Tobacco comments:    States working on quitting  Vaping Use   Vaping status: Never Used  Substance and Sexual Activity   Alcohol use: Yes    Comment: occasional   Drug use: Yes    Frequency: 7.0 times per week    Types: Marijuana   Sexual activity: Yes    Partners: Male    Birth control/protection: None    Comment: accepted condoms  Other Topics Concern   Not on file  Social History Narrative   Lives fiance [place] 8 years    Dog: [place] Storm y 3 years      Enjoys gardening, listening to music, just relaxing.      Diet: Well balanced, but does like to eat sweets.   Caffeine: Does not drink much tea or sodas, does not drink coffee, most drinks Gatorade.    Water: 3-5 bottles of water daily      Wears seat belt   Does not wear sun protection   Smoke detectors   Occasional use of phone while driving  Social Drivers of Corporate investment banker Strain: Low Risk  (09/28/2018)   Overall Financial Resource Strain (CARDIA)    Difficulty of Paying Living Expenses: Not hard at all  Food Insecurity: No Food Insecurity (09/28/2018)   Hunger Vital Sign    Worried About Running Out of Food in the Last Year: Never true    Ran Out of Food in the Last Year: Never true  Transportation Needs: No Transportation Needs (09/28/2018)   PRAPARE - Administrator, Civil Service (Medical): No    Lack of Transportation (Non-Medical): No  Physical Activity: Insufficiently Active (09/28/2018)   Exercise Vital Sign    Days of Exercise per Week: 1 day    Minutes of Exercise per Session: 30 min  Stress: Stress Concern Present (09/28/2018)    [employer] of Occupational Health - Occupational Stress Questionnaire    Feeling of Stress : To some extent  Social Connections: Moderately Isolated (09/28/2018)   Social Connection and Isolation Panel [NHANES]    Frequency of Communication with Friends and Family: Once a week    Frequency of Social Gatherings with Friends and Family: More than three times a week    Attends Religious Services: Never    Database administrator or Organizations: No    Attends Banker Meetings: Never    Marital Status: Never married    Labs: Lab Results  Component Value Date   HIV1RNAQUANT Not Detected 06/02/2023   HIV1RNAQUANT Not Detected 07/04/2022   HIV1RNAQUANT Not Detected 01/06/2022    RPR and STI Lab Results  Component Value Date   LABRPR NON-REACTIVE 04/29/2023   LABRPR NON-REACTIVE 02/17/2023   LABRPR NON-REACTIVE 11/20/2022   LABRPR NON-REACTIVE 07/04/2022   LABRPR NON-REACTIVE 04/08/2022    STI Results GC GC CT CT  06/02/2023  3:36 PM Negative   Negative    02/17/2023  4:19 PM Negative    Negative    Negative   Negative    Negative    Negative    11/20/2022  9:04 AM Negative    Negative    Negative   Negative    Negative    Negative    08/20/2022  9:25 AM Negative    Negative   Negative    Negative    04/08/2022  3:49 PM Negative    Negative    Negative   Negative    Negative    Negative    01/01/2022  4:40 PM Negative   Negative    01/01/2022  4:16 PM Negative    Negative   Negative    Negative    09/26/2021 11:32 AM Negative    Negative    Negative   Negative    Negative    Negative    08/14/2021 10:18 AM Negative    Negative    Negative   Negative    Negative    Negative    07/05/2021 12:58 AM Negative   Negative    06/28/2019  3:01 PM Negative   Negative    08/29/2015 10:27 AM  NOT DETECTED   NOT DETECTED     Hepatitis B Lab Results  Component Value Date   HEPBSAG NON-REACTIVE 09/26/2021   HEPBCAB NON-REACTIVE 09/26/2021    Hepatitis C Lab Results  Component Value Date   HEPCAB NON-REACTIVE 09/26/2021   Hepatitis A Lab Results  Component Value Date   HAV NON-REACTIVE 08/14/2021   Lipids: Lab Results  Component Value Date   CHOL  163 10/01/2022   TRIG 149 10/01/2022   HDL 50 10/01/2022   CHOLHDL 3.3 10/01/2022   VLDL 15 08/29/2015   LDLCALC 87 10/01/2022    TARGET DATE: The 11th of the month  Assessment: Eligah presents today for their Apretude injection and to follow up for HIV PrEP. No issues with past injections.  Screened patient for acute HIV symptoms such as fatigue, muscle aches, rash, sore throat, lymphadenopathy, headache, night sweats, nausea/vomiting/diarrhea, and fever. Patient denies any symptoms.   Per [employer] guidelines, a rapid HIV test should be drawn prior to Apretude administration. Due to state shortage of rapid HIV tests, this is temporarily unable to be done. Per decision from [facility] physicians, we will proceed with Apretude administration at this time without a negative rapid HIV test beforehand. HIV RNA was collected today and is in process.  Administered cabotegravir 600mg /53mL in right upper outer quadrant of the gluteal muscle. Will make follow up appointments for maintenance injections every 2 months.   No known exposures to any STIs and no signs or symptoms of any STIs today. Last STI screening was in January and was negative. No new partners since last injection and politely declines STI testing today. Requests doxyPEP refill today as he misplaced his previous bottle.   Accepts 1/2 HAV and 1/3 HPV vaccines today. Due for 2/2 HAV in 6 months; due for 2/3 HPV vaccine at next office visit in 2 months.   Plan: - Administer Apretude 600 mg x 1  - Maintenance injections scheduled for 5/6 with me  - Check HIV RNA - Administer 1/2 HAV and 1/3 HPV vaccines  - Refill doxyPEP  - Call with any issues or questions  Margarite Gouge, PharmD, CPP, BCIDP, AAHIVP Clinical  Pharmacist Practitioner Infectious Diseases Clinical Pharmacist [facility]

## 2023-06-25 ENCOUNTER — Ambulatory Visit (INDEPENDENT_AMBULATORY_CARE_PROVIDER_SITE_OTHER): Payer: 59 | Admitting: Pharmacist

## 2023-06-25 ENCOUNTER — Other Ambulatory Visit: Payer: Self-pay

## 2023-06-25 DIAGNOSIS — Z2981 Encounter for HIV pre-exposure prophylaxis: Secondary | ICD-10-CM | POA: Diagnosis not present

## 2023-06-25 DIAGNOSIS — Z23 Encounter for immunization: Secondary | ICD-10-CM | POA: Diagnosis not present

## 2023-06-25 DIAGNOSIS — Z113 Encounter for screening for infections with a predominantly sexual mode of transmission: Secondary | ICD-10-CM

## 2023-06-25 MED ORDER — CABOTEGRAVIR ER 600 MG/3ML IM SUER
600.0000 mg | Freq: Once | INTRAMUSCULAR | Status: AC
Start: 2023-06-25 — End: 2023-06-25
  Administered 2023-06-25: 600 mg via INTRAMUSCULAR

## 2023-06-25 MED ORDER — DOXYCYCLINE HYCLATE 100 MG PO TABS
ORAL_TABLET | ORAL | 0 refills | Status: AC
Start: 2023-06-25 — End: ?

## 2023-06-29 LAB — HIV-1 RNA QUANT-NO REFLEX-BLD
HIV 1 RNA Quant: NOT DETECTED {copies}/mL
HIV-1 RNA Quant, Log: NOT DETECTED {Log_copies}/mL

## 2023-08-20 ENCOUNTER — Telehealth: Payer: Self-pay

## 2023-08-20 ENCOUNTER — Other Ambulatory Visit (HOSPITAL_COMMUNITY): Payer: Self-pay

## 2023-08-20 NOTE — Telephone Encounter (Signed)
 [facility] Patient Advocate Encounter  I called patient to find out if his insurance is still active or if he had any new insurance .(which I could not find) Patient said he still have insurance I explained to the patient that his last 2 injections it came back as Coverage Termed. He said he was going to call his insurance to find out what is going on. He will call me back   Roylene Corn, CPhT Specialty Pharmacy Patient [facility] Phone: [phone] Fax:  [phone]

## 2023-08-20 NOTE — Telephone Encounter (Signed)
 Understood - no injections until this is resolved. Thanks!

## 2023-08-25 ENCOUNTER — Ambulatory Visit: Admitting: Pharmacist

## 2023-10-05 ENCOUNTER — Encounter: Payer: 59 | Admitting: Internal Medicine

## 2023-10-13 ENCOUNTER — Encounter: Payer: Self-pay | Admitting: Internal Medicine

## 2023-12-02 ENCOUNTER — Encounter: Payer: Self-pay | Admitting: Internal Medicine

## 2023-12-02 ENCOUNTER — Ambulatory Visit: Payer: Self-pay | Admitting: Internal Medicine

## 2023-12-02 VITALS — BP 114/69 | HR 71 | Ht 65.0 in | Wt 146.8 lb

## 2023-12-02 DIAGNOSIS — Z2981 Encounter for HIV pre-exposure prophylaxis: Secondary | ICD-10-CM

## 2023-12-02 DIAGNOSIS — A630 Anogenital (venereal) warts: Secondary | ICD-10-CM

## 2023-12-02 DIAGNOSIS — N5089 Other specified disorders of the male genital organs: Secondary | ICD-10-CM

## 2023-12-02 MED ORDER — IMIQUIMOD 5 % EX CREA
TOPICAL_CREAM | CUTANEOUS | 2 refills | Status: AC
Start: 2023-12-02 — End: ?

## 2023-12-02 MED ORDER — CLOTRIMAZOLE-BETAMETHASONE 1-0.05 % EX CREA: 1.0000 | TOPICAL_CREAM | Freq: Every day | CUTANEOUS | 0 refills | Status: AC

## 2023-12-02 NOTE — Progress Notes (Signed)
 Established Patient Office Visit  Subjective:  Patient ID: Mario Nguyen, male    DOB: 01/24/1991  Age: 33 y.o. MRN: [id number]  CC:  Chief Complaint  Patient presents with   Anal sore    HPI Mario Nguyen is a 33 y.o. male with past medical history of depression and tobacco abuse who follow-up of his chronic medical conditions.  He is followed by ID clinic for PrEP.  He has history of syphilis and gonorrhea, treated.  Denies any dysuria, hematuria or urethral discharge currently.  He was on Apretude , but has not had it recently due to lack of insurance.  He has started taking Descovy  for PrEP.  He has recurrent scrotal rash, which improves with Lotrisone  cream.  He also reports having a rectal area sore, which appears to be a wart.  Denies any pain currently.  He has been smoking about 6-7 cigarettes/day.     Past Medical History:  Diagnosis Date   Encounter for hepatitis C screening test for low risk patient 06/28/2019    History reviewed. No pertinent surgical history.  Family History  Problem Relation Age of Onset   Hypertension Mother    Learning disabilities Maternal Grandmother    Stroke Maternal Grandfather     Social History   Socioeconomic History   Marital status: Single    Spouse name: Not on file   Number of children: Not on file   Years of education: 10   Highest education level: 10th grade  Occupational History   Not on file  Tobacco Use   Smoking status: Some Days    Current packs/day: 0.30    Average packs/day: 0.3 packs/day for 6.0 years (1.8 ttl pk-yrs)    Types: Cigarettes   Smokeless tobacco: Never   Tobacco comments:    States working on quitting  Vaping Use   Vaping status: Never Used  Substance and Sexual Activity   Alcohol use: Yes    Comment: occasional   Drug use: Yes    Frequency: 7.0 times per week    Types: Marijuana   Sexual activity: Yes    Partners: Male    Birth control/protection: None    Comment:  accepted condoms  Other Topics Concern   Not on file  Social History Narrative   Lives fiance Mario 8 years    Dog: Manilla Nguyen y 3 years      Enjoys gardening, listening to music, just relaxing.      Diet: Well balanced, but does like to eat sweets.   Caffeine: Does not drink much tea or sodas, does not drink coffee, most drinks Gatorade.    Water: 3-5 bottles of water daily      Wears seat belt   Does not wear sun protection   Smoke detectors   Occasional use of phone while driving   Social Drivers of Health   Financial Resource Strain: Patient Declined (12/02/2023)   Overall Financial Resource Strain (CARDIA)    Difficulty of Paying Living Expenses: Patient declined  Food Insecurity: Patient Declined (12/02/2023)   Hunger Vital Sign    Worried About Running Out of Food in the Last Year: Patient declined    Ran Out of Food in the Last Year: Patient declined  Transportation Needs: No Transportation Needs (12/02/2023)   PRAPARE - Administrator, Civil Service (Medical): No    Lack of Transportation (Non-Medical): No  Physical Activity: Insufficiently Active (12/02/2023)   Exercise Vital Sign  Days of Exercise per Week: 1 day    Minutes of Exercise per Session: 30 min  Stress: No Stress Concern Present (12/02/2023)   [employer] of Occupational Health - Occupational Stress Questionnaire    Feeling of Stress: Only a little  Social Connections: Unknown (12/02/2023)   Social Connection and Isolation Panel    Frequency of Communication with Friends and Family: More than three times a week    Frequency of Social Gatherings with Friends and Family: Once a week    Attends Religious Services: 1 to 4 times per year    Active Member of [employer] or Organizations: No    Attends Engineer, structural: Not on file    Marital Status: Patient declined  Intimate Partner Violence: Not At Risk (09/28/2018)   Humiliation, Afraid, Rape, and Kick questionnaire    Fear of  Current or Ex-Partner: No    Emotionally Abused: No    Physically Abused: No    Sexually Abused: No    Outpatient Medications Prior to Visit  Medication Sig Dispense Refill   doxycycline  (VIBRA -TABS) 100 MG tablet Take 2 tablets (200 mg) by mouth within 24 hours after but no later than 72 hours after condomless sex. If you have sex again within 24 hours of taking doxycycline , take another dose 24 hours after your last dose. 60 tablet 0   emtricitabine -tenofovir  AF (DESCOVY ) 200-25 MG tablet Take 1 tablet by mouth daily. 30 tablet 0   fluticasone  (FLONASE ) 50 MCG/ACT nasal spray Place 1 spray into both nostrils 2 (two) times daily. 16 g 2   hydrocortisone  (ANUSOL -HC) 25 MG suppository Place 1 suppository (25 mg total) rectally 2 (two) times daily as needed for hemorrhoids or anal itching. 20 suppository 0   ondansetron  (ZOFRAN ) 4 MG tablet Take 1 tablet (4 mg total) by mouth every 6 (six) hours. 12 tablet 0   clotrimazole -betamethasone  (LOTRISONE ) cream Apply 1 Application topically daily. 30 g 0   No facility-administered medications prior to visit.    No Known Allergies  ROS Review of Systems  Constitutional:  Negative for chills and fever.  HENT:  Negative for congestion and sore throat.   Eyes:  Negative for pain and discharge.  Respiratory:  Negative for cough and shortness of breath.   Cardiovascular:  Negative for chest pain and palpitations.  Gastrointestinal:  Negative for diarrhea, nausea and vomiting.  Endocrine: Negative for polydipsia and polyuria.  Genitourinary:  Negative for dysuria and hematuria.  Musculoskeletal:  Negative for neck pain and neck stiffness.  Skin:  Positive for rash.  Neurological:  Negative for dizziness, weakness, numbness and headaches.  Psychiatric/Behavioral:  Negative for agitation, behavioral problems, decreased concentration, dysphoric mood, sleep disturbance and suicidal ideas.       Objective:    Physical Exam Vitals reviewed.   Constitutional:      General: He is not in acute distress.    Appearance: He is not diaphoretic.  HENT:     Head: Normocephalic and atraumatic.     Nose: Nose normal.     Mouth/Throat:     Mouth: Mucous membranes are moist.  Eyes:     General: No scleral icterus.    Extraocular Movements: Extraocular movements intact.  Cardiovascular:     Rate and Rhythm: Normal rate and regular rhythm.     Heart sounds: Normal heart sounds. No murmur heard. Pulmonary:     Breath sounds: Normal breath sounds. No wheezing or rales.  Musculoskeletal:     Cervical back:  Neck supple. No tenderness.     Right lower leg: No edema.     Left lower leg: No edema.  Skin:    General: Skin is warm.     Findings: Lesion (Perianal wart noted) present.  Neurological:     General: No focal deficit present.     Mental Status: He is alert and oriented to person, place, and time.  Psychiatric:        Mood and Affect: Mood normal.        Behavior: Behavior normal.     BP 114/69   Pulse 71   Ht 5' 5 (1.651 m)   Wt 146 lb 12.8 oz (66.6 kg)   SpO2 99%   BMI 24.43 kg/m  Wt Readings from Last 3 Encounters:  12/02/23 146 lb 12.8 oz (66.6 kg)  10/01/22 143 lb 9.6 oz (65.1 kg)  01/01/22 152 lb (68.9 kg)    Lab Results  Component Value Date   TSH 2.340 10/01/2022   Lab Results  Component Value Date   WBC 6.4 10/01/2022   HGB 13.6 10/01/2022   HCT 40.8 10/01/2022   MCV 85 10/01/2022   PLT 356 10/01/2022   Lab Results  Component Value Date   NA 141 10/01/2022   K 4.1 10/01/2022   CO2 24 10/01/2022   GLUCOSE 81 10/01/2022   BUN 15 10/01/2022   CREATININE 1.10 10/01/2022   BILITOT 0.4 10/01/2022   ALKPHOS 59 10/01/2022   AST 18 10/01/2022   ALT 16 10/01/2022   PROT 7.0 10/01/2022   ALBUMIN 4.4 10/01/2022   CALCIUM 9.7 10/01/2022   ANIONGAP 10 07/05/2021   EGFR 92 10/01/2022   Lab Results  Component Value Date   CHOL 163 10/01/2022   Lab Results  Component Value Date   HDL 50  10/01/2022   Lab Results  Component Value Date   LDLCALC 87 10/01/2022   Lab Results  Component Value Date   TRIG 149 10/01/2022   Lab Results  Component Value Date   CHOLHDL 3.3 10/01/2022   Lab Results  Component Value Date   HGBA1C 5.8 (H) 05/28/2021      Assessment & Plan:   Problem List Items Addressed This Visit       Digestive   Anal wart - Primary   Likely due to viral (HPV) infection Imiquimod  cream prescribed Needs dermatology evaluation, he will contact after obtaining insurance      Relevant Medications   imiquimod  (ALDARA ) 5 % cream   clotrimazole -betamethasone  (LOTRISONE ) cream   Other Relevant Orders   CBC with Differential/Platelet   CMP14+EGFR     Genitourinary   Genital lesion, male   Has been treated for syphilis Recurrent scrotal and groin area rash likely due to intertrigo, continue Lotrisone  cream       Relevant Medications   clotrimazole -betamethasone  (LOTRISONE ) cream   Other Relevant Orders   CBC with Differential/Platelet   CMP14+EGFR     Other   Encounter for pre-exposure prophylaxis for HIV   Needs to contact ID clinic -was on Apretude  Currently taking Descovy        Meds ordered this encounter  Medications   imiquimod  (ALDARA ) 5 % cream    Sig: Apply topically 3 (three) times a week.    Dispense:  12 each    Refill:  2   clotrimazole -betamethasone  (LOTRISONE ) cream    Sig: Apply 1 Application topically daily.    Dispense:  30 g    Refill:  0  Follow-up: Return in about 1 year (around 12/01/2024).    Suzzane MARLA Blanch, MD

## 2023-12-02 NOTE — Assessment & Plan Note (Addendum)
 Needs to contact ID clinic -was on Apretude  Currently taking Descovy 

## 2023-12-02 NOTE — Assessment & Plan Note (Signed)
 Likely due to viral (HPV) infection Imiquimod  cream prescribed Needs dermatology evaluation, he will contact after obtaining insurance

## 2023-12-02 NOTE — Patient Instructions (Signed)
 Please apply Imiquimod  over the wart.  Please apply Lotrisone  for rash.  Please contact ID clinic for continuation of your treatment.

## 2023-12-02 NOTE — Assessment & Plan Note (Addendum)
 Has been treated for syphilis Recurrent scrotal and groin area rash likely due to intertrigo, continue Lotrisone  cream

## 2024-12-02 ENCOUNTER — Encounter: Payer: Self-pay | Admitting: Internal Medicine
